# Patient Record
Sex: Male | Born: 1950 | Race: Black or African American | Hispanic: No | Marital: Married | Smoking: Never smoker
Health system: Southern US, Community
[De-identification: ages and names within clinical notes are randomized; demographics above are authoritative.]

## PROBLEM LIST (undated history)

## (undated) DIAGNOSIS — I639 Cerebral infarction, unspecified: Secondary | ICD-10-CM

## (undated) DIAGNOSIS — E785 Hyperlipidemia, unspecified: Secondary | ICD-10-CM

## (undated) DIAGNOSIS — I10 Essential (primary) hypertension: Secondary | ICD-10-CM

## (undated) HISTORY — PX: MOUTH SURGERY: SHX715

## (undated) HISTORY — PX: COLONOSCOPY: SHX174

---

## 2008-01-04 ENCOUNTER — Inpatient Hospital Stay: Payer: Self-pay | Admitting: Internal Medicine

## 2008-01-04 ENCOUNTER — Other Ambulatory Visit: Payer: Self-pay

## 2012-03-24 ENCOUNTER — Emergency Department: Payer: Self-pay | Admitting: *Deleted

## 2012-03-24 LAB — CBC
HGB: 13.5 g/dL (ref 13.0–18.0)
MCH: 29.6 pg (ref 26.0–34.0)
MCHC: 32.5 g/dL (ref 32.0–36.0)
MCV: 91 fL (ref 80–100)
Platelet: 279 10*3/uL (ref 150–440)
RDW: 13.2 % (ref 11.5–14.5)
WBC: 11.5 10*3/uL — ABNORMAL HIGH (ref 3.8–10.6)

## 2012-03-24 LAB — URINALYSIS, COMPLETE
Blood: NEGATIVE
Glucose,UR: NEGATIVE mg/dL (ref 0–75)
Leukocyte Esterase: NEGATIVE
Nitrite: NEGATIVE
Ph: 5 (ref 4.5–8.0)
Protein: 30
Specific Gravity: 1.029 (ref 1.003–1.030)
Squamous Epithelial: 1
WBC UR: 2 /HPF (ref 0–5)

## 2012-03-24 LAB — COMPREHENSIVE METABOLIC PANEL
Alkaline Phosphatase: 68 U/L (ref 50–136)
Anion Gap: 14 (ref 7–16)
Calcium, Total: 9.6 mg/dL (ref 8.5–10.1)
Chloride: 107 mmol/L (ref 98–107)
Creatinine: 1.28 mg/dL (ref 0.60–1.30)
EGFR (African American): >60
EGFR (Non-African Amer.): >60
Glucose: 110 mg/dL — ABNORMAL HIGH (ref 65–99)
Osmolality: 282 (ref 275–301)
Potassium: 4 mmol/L (ref 3.5–5.1)
SGPT (ALT): 42 U/L
Sodium: 139 mmol/L (ref 136–145)
Total Protein: 8.9 g/dL — ABNORMAL HIGH (ref 6.4–8.2)

## 2012-03-24 LAB — CK TOTAL AND CKMB (NOT AT ARMC)
CK, Total: 355 U/L — ABNORMAL HIGH (ref 35–232)
CK-MB: 2.6 ng/mL (ref 0.5–3.6)

## 2012-03-24 LAB — DRUG SCREEN, URINE
Amphetamines, Ur Screen: NEGATIVE (ref ?–1000)
Cannabinoid 50 Ng, Ur ~~LOC~~: NEGATIVE (ref ?–50)
Cocaine Metabolite,Ur ~~LOC~~: NEGATIVE (ref ?–300)
Opiate, Ur Screen: POSITIVE (ref ?–300)
Phencyclidine (PCP) Ur S: NEGATIVE (ref ?–25)
Tricyclic, Ur Screen: NEGATIVE (ref ?–1000)

## 2014-08-30 ENCOUNTER — Ambulatory Visit: Payer: Self-pay | Admitting: Gastroenterology

## 2014-09-03 LAB — PATHOLOGY REPORT

## 2016-02-27 ENCOUNTER — Ambulatory Visit: Payer: Self-pay | Admitting: Physician Assistant

## 2016-02-27 ENCOUNTER — Encounter: Payer: Self-pay | Admitting: Physician Assistant

## 2016-02-27 VITALS — BP 110/70 | HR 87 | Temp 98.5°F

## 2016-02-27 DIAGNOSIS — J069 Acute upper respiratory infection, unspecified: Secondary | ICD-10-CM

## 2016-02-27 MED ORDER — FLUTICASONE PROPIONATE 50 MCG/ACT NA SUSP: 2.0000 | Freq: Every day | NASAL | Status: DC

## 2016-02-27 MED ORDER — AMOXICILLIN 875 MG PO TABS
875.0000 mg | ORAL_TABLET | Freq: Two times a day (BID) | ORAL | Status: DC
Start: 2016-02-27 — End: 2016-06-04

## 2016-02-27 NOTE — Progress Notes (Signed)
S: C/o runny nose and congestion for 3 days, no fever, chills, cp/sob, v/d; some sore throat, had loose stools this am, no abd pain, only 1 episode of diarrhea, denies body aches, didn't get flu shot; Using otc meds: nyquil, halls  O: PE: vitals wnl, nad, perrl eomi, normocephalic, tms dull, nasal mucosa red and swollen, throat injected, neck supple no lymph, lungs c t a, cv rrr, abd soft nontender bs normal all 4 quads, neuro intact, cough is congested  A:  Acute  uri   P: amoxil, flonase, otc immodium if needed, if diarrhea worsens stop the antibiotic; drink fluids, continue regular meds , use otc meds of choice, return if not improving in 5 days, return earlier if worsening

## 2016-06-04 ENCOUNTER — Encounter: Payer: Self-pay | Admitting: Physician Assistant

## 2016-06-04 ENCOUNTER — Ambulatory Visit: Payer: Self-pay | Admitting: Physician Assistant

## 2016-06-04 VITALS — BP 140/80 | HR 71 | Temp 98.1°F

## 2016-06-04 DIAGNOSIS — K2100 Gastro-esophageal reflux disease with esophagitis, without bleeding: Secondary | ICD-10-CM

## 2016-06-04 MED ORDER — OMEPRAZOLE 40 MG PO CPDR: 40.0000 mg | DELAYED_RELEASE_CAPSULE | Freq: Every day | ORAL | Status: DC

## 2016-06-04 NOTE — Progress Notes (Signed)
   Subjective:Stomach pain    Patient ID: Darin Lang, Darin Lang    DOB: 1951-12-06, 65 y.o.   MRN: [id number]  HPI Patient states abdominal and epigastric pain for 6 days. Onset after heavy meal on Father's day. Describes pain as burning sensation. Denies back radiating to back. States BM movements are normal. Mild nausea. No vomiting. Food and coffee increased discomfort. States mild relief with anti-acid. Rates pain as 8/10.Hyper    Review of Systems    Hypertension and hyperlipidema. Objective:   Physical Exam HEENT unremarkable, Neck supple, Lungs CTA and Heart RRR. No acute distress VSS. Negative HSM, decreased BS, Soft, non-tender to palpation.       Assessment & Plan:GERD  Trial of Omeprazole. Avoid late night meals before sleeping. Follow up 3 days if no improvement. Advised to go to ER if condition worsen.

## 2016-07-06 ENCOUNTER — Other Ambulatory Visit: Payer: Self-pay | Admitting: Physician Assistant

## 2016-11-12 ENCOUNTER — Ambulatory Visit: Payer: Self-pay | Admitting: Physician Assistant

## 2016-11-12 DIAGNOSIS — Z299 Encounter for prophylactic measures, unspecified: Secondary | ICD-10-CM

## 2016-11-12 NOTE — Progress Notes (Signed)
Patient came in to have blood drawn for testing per Mardella LaymanLindsey Cornetto's orders.

## 2016-11-13 LAB — CMP12+LP+TP+TSH+6AC+PSA+CBC…
ALBUMIN: 4.3 g/dL (ref 3.6–4.8)
ALT: 29 IU/L (ref 0–44)
AST: 30 IU/L (ref 0–40)
Albumin/Globulin Ratio: 1.7 (ref 1.2–2.2)
Alkaline Phosphatase: 59 IU/L (ref 39–117)
BASOS: 0 %
BUN / CREAT RATIO: 13 (ref 10–24)
BUN: 13 mg/dL (ref 8–27)
Basophils Absolute: 0 10*3/uL (ref 0.0–0.2)
Bilirubin Total: 0.5 mg/dL (ref 0.0–1.2)
CALCIUM: 9.4 mg/dL (ref 8.6–10.2)
CHOL/HDL RATIO: 3.2 ratio (ref 0.0–5.0)
CHOLESTEROL TOTAL: 162 mg/dL (ref 100–199)
CREATININE: 1.03 mg/dL (ref 0.76–1.27)
Chloride: 104 mmol/L (ref 96–106)
EOS (ABSOLUTE): 0.3 10*3/uL (ref 0.0–0.4)
EOS: 4 %
Free Thyroxine Index: 1.6 (ref 1.2–4.9)
GFR calc Af Amer: 89 mL/min/{1.73_m2} (ref >59)
GFR, EST NON AFRICAN AMERICAN: 77 mL/min/{1.73_m2} (ref >59)
GGT: 27 IU/L (ref 0–65)
GLOBULIN, TOTAL: 2.5 g/dL (ref 1.5–4.5)
Glucose: 88 mg/dL (ref 65–99)
HDL: 50 mg/dL (ref >39)
HEMATOCRIT: 36 % — AB (ref 37.5–51.0)
Hemoglobin: 12.4 g/dL — ABNORMAL LOW (ref 12.6–17.7)
Immature Grans (Abs): 0 10*3/uL (ref 0.0–0.1)
Immature Granulocytes: 0 %
Iron: 45 ug/dL (ref 38–169)
LDH: 212 IU/L (ref 121–224)
LDL Calculated: 99 mg/dL (ref 0–99)
Lymphocytes Absolute: 2 10*3/uL (ref 0.7–3.1)
Lymphs: 30 %
MCH: 30.5 pg (ref 26.6–33.0)
MCHC: 34.4 g/dL (ref 31.5–35.7)
MCV: 89 fL (ref 79–97)
MONOS ABS: 0.6 10*3/uL (ref 0.1–0.9)
Monocytes: 9 %
NEUTROS ABS: 3.7 10*3/uL (ref 1.4–7.0)
NEUTROS PCT: 57 %
PHOSPHORUS: 3.5 mg/dL (ref 2.5–4.5)
POTASSIUM: 4.2 mmol/L (ref 3.5–5.2)
PROSTATE SPECIFIC AG, SERUM: 1.1 ng/mL (ref 0.0–4.0)
Platelets: 265 10*3/uL (ref 150–379)
RBC: 4.06 x10E6/uL — ABNORMAL LOW (ref 4.14–5.80)
RDW: 14.4 % (ref 12.3–15.4)
SODIUM: 144 mmol/L (ref 134–144)
T3 Uptake Ratio: 23 % — ABNORMAL LOW (ref 24–39)
T4 TOTAL: 7 ug/dL (ref 4.5–12.0)
TRIGLYCERIDES: 67 mg/dL (ref 0–149)
TSH: 4.64 u[IU]/mL — AB (ref 0.450–4.500)
Total Protein: 6.8 g/dL (ref 6.0–8.5)
Uric Acid: 4.4 mg/dL (ref 3.7–8.6)
VLDL Cholesterol Cal: 13 mg/dL (ref 5–40)
WBC: 6.6 10*3/uL (ref 3.4–10.8)

## 2016-11-13 LAB — URINALYSIS, COMPLETE
Bilirubin, UA: NEGATIVE
Glucose, UA: NEGATIVE
Ketones, UA: NEGATIVE
LEUKOCYTES UA: NEGATIVE
Nitrite, UA: NEGATIVE
PH UA: 5.5 (ref 5.0–7.5)
Protein, UA: NEGATIVE
RBC, UA: NEGATIVE
Specific Gravity, UA: 1.026 (ref 1.005–1.030)
Urobilinogen, Ur: 0.2 mg/dL (ref 0.2–1.0)

## 2016-11-13 LAB — MICROSCOPIC EXAMINATION
Casts: NONE SEEN /lpf
RBC MICROSCOPIC, UA: NONE SEEN /HPF (ref 0–?)

## 2016-11-13 LAB — TESTOSTERONE: Testosterone: 532 ng/dL (ref 264–916)

## 2018-01-18 ENCOUNTER — Ambulatory Visit: Payer: Self-pay

## 2018-01-19 ENCOUNTER — Ambulatory Visit: Payer: Self-pay | Admitting: Medical

## 2018-01-19 VITALS — BP 123/66 | HR 76 | Temp 98.3°F | Resp 16 | Ht 67.0 in | Wt 193.0 lb

## 2018-01-19 DIAGNOSIS — I10 Essential (primary) hypertension: Secondary | ICD-10-CM | POA: Insufficient documentation

## 2018-01-19 DIAGNOSIS — Z008 Encounter for other general examination: Secondary | ICD-10-CM

## 2018-01-19 DIAGNOSIS — Z0189 Encounter for other specified special examinations: Principal | ICD-10-CM

## 2018-01-19 DIAGNOSIS — E785 Hyperlipidemia, unspecified: Secondary | ICD-10-CM | POA: Insufficient documentation

## 2018-01-19 NOTE — Progress Notes (Signed)
   Subjective:    Patient ID: Darin Lang, male    DOB: 10/15/1951, 67 y.o.   MRN: [id number]  HPI Here for annual physical and Bometric physical works in the [employer].  Review of Systems  Constitutional: Negative for chills and fever.  HENT: Positive for congestion and rhinorrhea (clear). Negative for sore throat and tinnitus.   Respiratory: Negative for cough and shortness of breath.   Cardiovascular: Negative for chest pain.  Gastrointestinal: Negative for abdominal pain.  Genitourinary: Positive for urgency. Negative for dysuria.  Musculoskeletal: Negative for myalgias.  Skin: Negative for rash.  Neurological: Negative for dizziness, syncope and light-headedness.  Psychiatric/Behavioral: Negative for behavioral problems, self-injury and suicidal ideas. The patient is not nervous/anxious.        Objective:   Physical Exam  Constitutional: He is oriented to person, place, and time. He appears well-developed and well-nourished.  HENT:  Head: Normocephalic and atraumatic.  Right Ear: Hearing, external ear and ear canal normal. A middle ear effusion is present.  Left Ear: Hearing, tympanic membrane, external ear and ear canal normal.  Nose: Rhinorrhea present.  Mouth/Throat: Uvula is midline, oropharynx is clear and moist and mucous membranes are normal.  Eyes: Conjunctivae, EOM and lids are normal. Pupils are equal, round, and reactive to light.  Fundoscopic exam:      The right eye shows red reflex.       The left eye shows red reflex.  Neck: Normal range of motion. Neck supple. No thyromegaly present.  Cardiovascular: Normal rate, regular rhythm and normal heart sounds.  Pulmonary/Chest: Breath sounds normal.  Abdominal: Soft. Normal appearance and bowel sounds are normal.  Musculoskeletal: Normal range of motion.  Lymphadenopathy:    He has no cervical adenopathy.  Neurological: He is alert and oriented to person, place, and time. He has normal strength. He displays a negative  Romberg sign. GCS eye subscore is 4. GCS verbal subscore is 5. GCS motor subscore is 6.  Reflex Scores:      Patellar reflexes are 2+ on the right side and 2+ on the left side.      Achilles reflexes are 2+ on the right side and 2+ on the left side. Skin: Skin is warm, dry and intact.  Psychiatric: He has a normal mood and affect. His speech is normal and behavior is normal. Judgment and thought content normal. Cognition and memory are normal.  Nursing note and vitals reviewed.   Finger to nose and heel to toe wnl      Assessment & Plan:  Physical exam , Biometric screening Wondering about  Colonoscopy, he had one 6-8 yrs ago at the [facility]. He is to contact them and see what his recommendation is based on his results.  He can either have us refer him or his primary doctor or the [facility]. He verbalizes understanding and has no questions at discharge

## 2018-01-20 LAB — CMP12+LP+TP+TSH+6AC+PSA+CBC…
A/G RATIO: 1.8 (ref 1.2–2.2)
ALT: 16 IU/L (ref 0–44)
AST: 19 IU/L (ref 0–40)
Albumin: 4.5 g/dL (ref 3.6–4.8)
Alkaline Phosphatase: 54 IU/L (ref 39–117)
BUN/Creatinine Ratio: 15 (ref 10–24)
BUN: 15 mg/dL (ref 8–27)
Basophils Absolute: 0 10*3/uL (ref 0.0–0.2)
Basos: 0 %
Bilirubin Total: 0.5 mg/dL (ref 0.0–1.2)
CHLORIDE: 104 mmol/L (ref 96–106)
CREATININE: 0.99 mg/dL (ref 0.76–1.27)
Calcium: 9.4 mg/dL (ref 8.6–10.2)
Chol/HDL Ratio: 3.7 ratio (ref 0.0–5.0)
Cholesterol, Total: 187 mg/dL (ref 100–199)
EOS (ABSOLUTE): 0.3 10*3/uL (ref 0.0–0.4)
ESTIMATED CHD RISK: 0.6 times avg. (ref 0.0–1.0)
Eos: 5 %
Free Thyroxine Index: 1.5 (ref 1.2–4.9)
GFR, EST AFRICAN AMERICAN: 93 mL/min/{1.73_m2} (ref >59)
GFR, EST NON AFRICAN AMERICAN: 80 mL/min/{1.73_m2} (ref >59)
GGT: 27 IU/L (ref 0–65)
GLUCOSE: 87 mg/dL (ref 65–99)
Globulin, Total: 2.5 g/dL (ref 1.5–4.5)
HDL: 51 mg/dL (ref >39)
Hematocrit: 37.4 % — ABNORMAL LOW (ref 37.5–51.0)
Hemoglobin: 12.7 g/dL — ABNORMAL LOW (ref 13.0–17.7)
IRON: 72 ug/dL (ref 38–169)
Immature Grans (Abs): 0 10*3/uL (ref 0.0–0.1)
Immature Granulocytes: 0 %
LDH: 189 IU/L (ref 121–224)
LDL Calculated: 118 mg/dL — ABNORMAL HIGH (ref 0–99)
Lymphocytes Absolute: 1.8 10*3/uL (ref 0.7–3.1)
Lymphs: 34 %
MCH: 29.6 pg (ref 26.6–33.0)
MCHC: 34 g/dL (ref 31.5–35.7)
MCV: 87 fL (ref 79–97)
MONOCYTES: 8 %
MONOS ABS: 0.4 10*3/uL (ref 0.1–0.9)
NEUTROS ABS: 2.9 10*3/uL (ref 1.4–7.0)
Neutrophils: 53 %
PHOSPHORUS: 2.8 mg/dL (ref 2.5–4.5)
POTASSIUM: 4.9 mmol/L (ref 3.5–5.2)
Platelets: 255 10*3/uL (ref 150–379)
Prostate Specific Ag, Serum: 1.5 ng/mL (ref 0.0–4.0)
RBC: 4.29 x10E6/uL (ref 4.14–5.80)
RDW: 13.6 % (ref 12.3–15.4)
Sodium: 143 mmol/L (ref 134–144)
T3 UPTAKE RATIO: 23 % — AB (ref 24–39)
T4, Total: 6.7 ug/dL (ref 4.5–12.0)
TOTAL PROTEIN: 7 g/dL (ref 6.0–8.5)
TSH: 2.99 u[IU]/mL (ref 0.450–4.500)
Triglycerides: 88 mg/dL (ref 0–149)
URIC ACID: 4.4 mg/dL (ref 3.7–8.6)
VLDL Cholesterol Cal: 18 mg/dL (ref 5–40)
WBC: 5.4 10*3/uL (ref 3.4–10.8)

## 2018-01-20 LAB — HCV COMMENT:

## 2018-01-20 LAB — HEPATITIS C ANTIBODY (REFLEX): HCV Ab: <0.1 s/co ratio (ref 0.0–0.9)

## 2018-01-20 LAB — VITAMIN D 25 HYDROXY (VIT D DEFICIENCY, FRACTURES): Vit D, 25-Hydroxy: 27.2 ng/mL — ABNORMAL LOW (ref 30.0–100.0)

## 2018-10-16 ENCOUNTER — Other Ambulatory Visit: Payer: Self-pay | Admitting: Family Medicine

## 2018-10-16 DIAGNOSIS — R221 Localized swelling, mass and lump, neck: Secondary | ICD-10-CM

## 2018-10-20 ENCOUNTER — Ambulatory Visit
Admission: RE | Admit: 2018-10-20 | Discharge: 2018-10-20 | Disposition: A | Discharge status: Home / Self Care | Payer: Managed Care, Other (non HMO) | Source: Ambulatory Visit | Attending: Family Medicine | Admitting: Family Medicine

## 2018-10-20 DIAGNOSIS — R221 Localized swelling, mass and lump, neck: Secondary | ICD-10-CM | POA: Diagnosis present

## 2020-01-06 ENCOUNTER — Encounter: Payer: Self-pay | Admitting: Emergency Medicine

## 2020-01-06 ENCOUNTER — Emergency Department: Payer: Medicare HMO

## 2020-01-06 ENCOUNTER — Emergency Department
Admission: EM | Admit: 2020-01-06 | Discharge: 2020-01-06 | Disposition: A | Discharge status: Home / Self Care | Payer: Medicare HMO | Attending: Emergency Medicine | Admitting: Emergency Medicine

## 2020-01-06 ENCOUNTER — Other Ambulatory Visit: Payer: Self-pay

## 2020-01-06 DIAGNOSIS — Z7982 Long term (current) use of aspirin: Secondary | ICD-10-CM | POA: Insufficient documentation

## 2020-01-06 DIAGNOSIS — I10 Essential (primary) hypertension: Secondary | ICD-10-CM | POA: Insufficient documentation

## 2020-01-06 DIAGNOSIS — Z79899 Other long term (current) drug therapy: Secondary | ICD-10-CM | POA: Diagnosis not present

## 2020-01-06 DIAGNOSIS — Z20822 Contact with and (suspected) exposure to covid-19: Secondary | ICD-10-CM | POA: Diagnosis not present

## 2020-01-06 DIAGNOSIS — R509 Fever, unspecified: Secondary | ICD-10-CM | POA: Insufficient documentation

## 2020-01-06 DIAGNOSIS — K115 Sialolithiasis: Secondary | ICD-10-CM | POA: Insufficient documentation

## 2020-01-06 DIAGNOSIS — K113 Abscess of salivary gland: Secondary | ICD-10-CM

## 2020-01-06 DIAGNOSIS — R6884 Jaw pain: Secondary | ICD-10-CM | POA: Diagnosis present

## 2020-01-06 HISTORY — DX: Hyperlipidemia, unspecified: E78.5

## 2020-01-06 HISTORY — DX: Essential (primary) hypertension: I10

## 2020-01-06 LAB — COMPREHENSIVE METABOLIC PANEL
ALT: 15 U/L (ref 0–44)
AST: 21 U/L (ref 15–41)
Albumin: 4 g/dL (ref 3.5–5.0)
Alkaline Phosphatase: 62 U/L (ref 38–126)
Anion gap: 11 (ref 5–15)
BUN: 28 mg/dL — ABNORMAL HIGH (ref 8–23)
CO2: 26 mmol/L (ref 22–32)
Calcium: 9.1 mg/dL (ref 8.9–10.3)
Chloride: 102 mmol/L (ref 98–111)
Creatinine, Ser: 1.22 mg/dL (ref 0.61–1.24)
GFR calc Af Amer: >60 mL/min (ref >60)
GFR calc non Af Amer: >60 mL/min (ref >60)
Glucose, Bld: 88 mg/dL (ref 70–99)
Potassium: 4 mmol/L (ref 3.5–5.1)
Sodium: 139 mmol/L (ref 135–145)
Total Bilirubin: 1.7 mg/dL — ABNORMAL HIGH (ref 0.3–1.2)
Total Protein: 8.3 g/dL — ABNORMAL HIGH (ref 6.5–8.1)

## 2020-01-06 LAB — CBC WITH DIFFERENTIAL/PLATELET
Abs Immature Granulocytes: 0.04 10*3/uL (ref 0.00–0.07)
Basophils Absolute: 0 10*3/uL (ref 0.0–0.1)
Basophils Relative: 0 %
Eosinophils Absolute: 0.2 10*3/uL (ref 0.0–0.5)
Eosinophils Relative: 3 %
HCT: 39.3 % (ref 39.0–52.0)
Hemoglobin: 13 g/dL (ref 13.0–17.0)
Immature Granulocytes: 0 %
Lymphocytes Relative: 19 %
Lymphs Abs: 1.8 10*3/uL (ref 0.7–4.0)
MCH: 29.8 pg (ref 26.0–34.0)
MCHC: 33.1 g/dL (ref 30.0–36.0)
MCV: 90.1 fL (ref 80.0–100.0)
Monocytes Absolute: 1.1 10*3/uL — ABNORMAL HIGH (ref 0.1–1.0)
Monocytes Relative: 11 %
Neutro Abs: 6.2 10*3/uL (ref 1.7–7.7)
Neutrophils Relative %: 67 %
Platelets: 246 10*3/uL (ref 150–400)
RBC: 4.36 MIL/uL (ref 4.22–5.81)
RDW: 12.6 % (ref 11.5–15.5)
WBC: 9.4 10*3/uL (ref 4.0–10.5)
nRBC: 0 % (ref 0.0–0.2)

## 2020-01-06 LAB — RESPIRATORY PANEL BY RT PCR (FLU A&B, COVID)
Influenza A by PCR: NEGATIVE
Influenza B by PCR: NEGATIVE
SARS Coronavirus 2 by RT PCR: NEGATIVE

## 2020-01-06 MED ORDER — CLINDAMYCIN HCL 150 MG PO CAPS: 300.0000 mg | ORAL_CAPSULE | Freq: Three times a day (TID) | ORAL | 0 refills | Status: DC

## 2020-01-06 MED ORDER — IOHEXOL 300 MG/ML  SOLN
75.0000 mL | Freq: Once | INTRAMUSCULAR | Status: AC | PRN
  Administered 2020-01-06: 12:00:00 75 mL via INTRAVENOUS
  Filled 2020-01-06: qty 75

## 2020-01-06 MED ORDER — MORPHINE SULFATE (PF) 4 MG/ML IV SOLN
4.0000 mg | Freq: Once | INTRAVENOUS | Status: AC
  Administered 2020-01-06: 4 mg via INTRAVENOUS
  Filled 2020-01-06: qty 1

## 2020-01-06 MED ORDER — HYDROCODONE-ACETAMINOPHEN 5-325 MG PO TABS: 1.0000 | ORAL_TABLET | Freq: Four times a day (QID) | ORAL | 0 refills | Status: DC | PRN

## 2020-01-06 MED ORDER — SODIUM CHLORIDE 0.9 % IV SOLN
3.0000 g | Freq: Once | INTRAVENOUS | Status: AC
  Administered 2020-01-06: 3 g via INTRAVENOUS
  Filled 2020-01-06: qty 8

## 2020-01-06 MED ORDER — PREDNISONE 10 MG (21) PO TBPK: ORAL_TABLET | ORAL | 0 refills | Status: DC

## 2020-01-06 MED ORDER — ONDANSETRON HCL 4 MG/2ML IJ SOLN
4.0000 mg | Freq: Once | INTRAMUSCULAR | Status: AC
  Administered 2020-01-06: 4 mg via INTRAVENOUS
  Filled 2020-01-06: qty 2

## 2020-01-06 MED ORDER — DEXAMETHASONE SODIUM PHOSPHATE 10 MG/ML IJ SOLN
10.0000 mg | Freq: Once | INTRAMUSCULAR | Status: AC
  Administered 2020-01-06: 10 mg via INTRAVENOUS
  Filled 2020-01-06: qty 1

## 2020-01-06 NOTE — ED Notes (Signed)
See triage note   Presents with pain to right side of jaw for the past week    Was seen at [facility] and placed on Augmentin for poss infection to salivary gland   Feels like sx's are getting worse  having increased pain with swallowing and chewing  Low grade temp on arrival

## 2020-01-06 NOTE — Discharge Instructions (Addendum)
Follow-up with Dr. Andee Poles tomorrow at [phone].  Take medication as prescribed.  Return if worsening.

## 2020-01-06 NOTE — ED Notes (Signed)
Discussed patient with Dr. Fuller Plan, no additional orders at this time.

## 2020-01-06 NOTE — ED Triage Notes (Signed)
Pt arrived via POV with reports of right side salivary gland problem x 1 week. Pt was seen at [facility] on 1/21 and prescribed augmentin BID which he states he started that day, pt states sxs are not getting any better, but not worsening.  Pt c/o pain when swallowing and with chewing. Swelling noted to right side.

## 2020-01-06 NOTE — ED Notes (Signed)
Patient is resting comfortably. 

## 2020-01-06 NOTE — ED Provider Notes (Signed)
[facility] Emergency Department Provider Note  ____________________________________________   First MD Initiated Contact with Patient 01/06/20 1015     (approximate)  I have reviewed the triage vital signs and the nursing notes.   HISTORY  Chief Complaint Sore Throat    HPI Darin Lang is a 69 y.o. male presents emergency department complaining of right-sided jaw pain for the past week.  Was seen at [facility] and told he had infection in his salivary gland.  States he has taken Augmentin for 3 days and has not had any relief.  Feels like he is getting worse.  Increased pain with swallowing and chewing.  States he has had a little low-grade fever.  No vomiting or diarrhea.  No chest pain or shortness of breath.    Past Medical History:  Diagnosis Date  . Hyperlipidemia   . Hypertension     Patient Active Problem List   Diagnosis Date Noted  . Benign essential hypertension 01/19/2018  . Other and unspecified hyperlipidemia 01/19/2018    Past Surgical History:  Procedure Laterality Date  . MOUTH SURGERY     salivary gland    Prior to Admission medications   Medication Sig Start Date End Date Taking? Authorizing Provider  aspirin 81 MG chewable tablet Chew by mouth daily.    [provider]  atorvastatin (LIPITOR) 20 MG tablet TAKE 1 TABLET (20 MG TOTAL) BY MOUTH ONCE DAILY. 01/31/16   [provider]  clindamycin (CLEOCIN) 150 MG capsule Take 2 capsules (300 mg total) by mouth 3 (three) times daily. 01/06/20   [name], Linden Dolin, PA-C  ferrous sulfate 325 (65 FE) MG tablet Take 325 mg by mouth daily with breakfast.    [provider]  HYDROcodone-acetaminophen (NORCO/VICODIN) 5-325 MG tablet Take 1 tablet by mouth every 6 (six) hours as needed for moderate pain. 01/06/20   [name], Linden Dolin, PA-C  losartan (COZAAR) 50 MG tablet TAKE 1 TABLET (50 MG TOTAL) BY MOUTH ONCE DAILY. 01/31/16   [provider]    multivitamin-iron-minerals-folic acid (CENTRUM) chewable tablet Chew 1 tablet by mouth daily.    [provider]  predniSONE (STERAPRED UNI-PAK 21 TAB) 10 MG (21) TBPK tablet Take 6 pills on day one then decrease by 1 pill each day 01/06/20   [name], PA-C    Allergies Patient has no known allergies.  No family history on file.  Social History Social History   Tobacco Use  . Smoking status: Never Smoker  . Smokeless tobacco: Never Used  Substance Use Topics  . Alcohol use: Not on file  . Drug use: Not on file    Review of Systems  Constitutional: No fever/chills Eyes: No visual changes. ENT: No sore throat.  Positive swelling on the right side of the neck Respiratory: Denies cough Cardiovascular: Denies chest pain Gastrointestinal: Denies abdominal pain Genitourinary: Negative for dysuria. Musculoskeletal: Negative for back pain. Skin: Negative for rash. Psychiatric: no mood changes,     ____________________________________________   PHYSICAL EXAM:  VITAL SIGNS: ED Triage Vitals  Enc Vitals Group     BP 01/06/20 0942 132/71     Pulse Rate 01/06/20 0942 (!) 108     Resp 01/06/20 0942 16     Temp 01/06/20 0942 99 F (37.2 C)     Temp Source 01/06/20 0942 Oral     SpO2 01/06/20 0942 100 %     Weight 01/06/20 0942 180 lb (81.6 kg)     Height 01/06/20  9563 5' 7"  (1.702 m)     Head Circumference --      Peak Flow --      Pain Score 01/06/20 0951 7     Pain Loc --      Pain Edu? --      Excl. in [place]? --     Constitutional: Alert and oriented. Well appearing and in no acute distress. Eyes: Conjunctivae are normal.  Head: Atraumatic. Nose: No congestion/rhinnorhea. Mouth/Throat: Mucous membranes are moist.  No dental abscess noted Neck:  supple no lymphadenopathy noted, large ?salivary stone is noted under the mandible Cardiovascular: Normal rate, regular rhythm. Heart sounds are normal Respiratory: Normal respiratory effort.  No retractions,  lungs c t a  GU: deferred Musculoskeletal: FROM all extremities, warm and well perfused Neurologic:  Normal speech and language.  Skin:  Skin is warm, dry and intact. No rash noted. Psychiatric: Mood and affect are normal. Speech and behavior are normal.  ____________________________________________   LABS (all labs ordered are listed, but only abnormal results are displayed)  Labs Reviewed  COMPREHENSIVE METABOLIC PANEL - Abnormal; Notable for the following components:      Result Value   BUN 28 (*)    Total Protein 8.3 (*)    Total Bilirubin 1.7 (*)    All other components within normal limits  CBC WITH DIFFERENTIAL/PLATELET - Abnormal; Notable for the following components:   Monocytes Absolute 1.1 (*)    All other components within normal limits  RESPIRATORY PANEL BY RT PCR (FLU A&B, COVID)   ____________________________________________   ____________________________________________  RADIOLOGY  CT soft tissue neck with IV contrast shows a salivary stone and a abscess  ____________________________________________   PROCEDURES  Procedure(s) performed: Saline lock, Unasyn IV, Decadron IV, morphine 4 mg IV, Zofran 4 mg  Procedures    ____________________________________________   INITIAL IMPRESSION / ASSESSMENT AND PLAN / ED COURSE  Pertinent labs & imaging results that were available during my care of the patient were reviewed by me and considered in my medical decision making (see chart for details).   Patient 69 year old male presents emergency department complaining of a salivary gland infection.  Was seen at [facility] and given Augmentin.  Is taking medication for 3 days and feels that he is getting worse instead of better.  Physical exam shows patient to appear well.  Vitals are normal.  Swelling noted to the right side of the neck along with a hard mass noted under the mandible  CBC, metabolic panel, CT soft tissue of the neck with Saline lock, morphine 4 mg  IV, Zofran 4 mg IV  CBC met BMP basically normal with some elevation in the BUN.  CT soft tissue of the neck does show an abscess surrounding a 8 x 10 mm stone, stranding into the soft tissues adjacent to the pharynx  Paged Dr. Pryor Ochoa He states he is in the hospital here and will come down to see the patient.  At first Dr. Pryor Ochoa thought he would be admitting the patient, however he was able to expel a large amount of fluid from the abscess around the stone.  He states that if the patient feels better after the steroids and antibiotics through the IV that he may be discharged.  I waited 2 hours before rechecking on the patient.  He states he feels much better.  He will be discharged with prescription for clindamycin, Sterapred, and Vicodin.  He is to follow-up with Dr. Pryor Ochoa tomorrow at 1015 in his office.  He states he understands will comply.  He was given strict instructions to return to the emergency department if worsening.   Darin Lang was evaluated in Emergency Department on 01/06/2020 for the symptoms described in the history of present illness. He was evaluated in the context of the global COVID-19 pandemic, which necessitated consideration that the patient might be at risk for infection with the SARS-CoV-2 virus that causes COVID-19. Institutional protocols and algorithms that pertain to the evaluation of patients at risk for COVID-19 are in a state of rapid change based on information released by regulatory bodies including the CDC and federal and state organizations. These policies and algorithms were followed during the patient's care in the ED.   As part of my medical decision making, I reviewed the following data within the [place] notes reviewed and incorporated, Labs reviewed see above, Old chart reviewed, Radiograph reviewed see above, A consult was requested and obtained from this/these consultant(s) ENT, Notes from prior ED visits and [place] Controlled  Substance Database  ____________________________________________   FINAL CLINICAL IMPRESSION(S) / ED DIAGNOSES  Final diagnoses:  Salivary gland abscess  Salivary stone      NEW MEDICATIONS STARTED DURING THIS VISIT:  Discharge Medication List as of 01/06/2020  3:41 PM    START taking these medications   Details  clindamycin (CLEOCIN) 150 MG capsule Take 2 capsules (300 mg total) by mouth 3 (three) times daily., Starting Sun 01/06/2020, Normal    HYDROcodone-acetaminophen (NORCO/VICODIN) 5-325 MG tablet Take 1 tablet by mouth every 6 (six) hours as needed for moderate pain., Starting Sun 01/06/2020, Normal    predniSONE (STERAPRED UNI-PAK 21 TAB) 10 MG (21) TBPK tablet Take 6 pills on day one then decrease by 1 pill each day, Normal         Note:  This document was prepared using Dragon voice recognition software and may include unintentional dictation errors.    [name], PA-C 01/06/20 1601    Vanessa [place], MD 01/07/20 [phone]

## 2020-01-06 NOTE — Consult Note (Signed)
Darin Lang, [name] [id number] 03/11/1951 Concha Se, MD  Reason for Consult: Sialolithiasis/sialoadenitits  HPI: 69 y.o. male presents to ED with 9 day history of right sided neck pain and swelling.  Patient reports similar issue 9 years ago treated with drainage at [facility] hospital and told he had salivary stone.  Seen by PCP and given antibiotics.  Reports increase in swelling and pain with chewing.  CT scan done with concern for abscess and large sialolith.  Patient reports pain with chewing and right neck pain.  Reports discharge from under tongue.  No breathing issues.  Allergies: No Known Allergies  ROS: Review of systems normal other than 12 systems except per HPI.  PMH:  Past Medical History:  Diagnosis Date  . Hyperlipidemia   . Hypertension     FH: No family history on file.  SH:  Social History   Socioeconomic History  . Marital status: Married    Spouse name: Not on file  . Number of children: Not on file  . Years of education: Not on file  . Highest education level: Not on file  Occupational History  . Not on file  Tobacco Use  . Smoking status: Never Smoker  . Smokeless tobacco: Never Used  Substance and Sexual Activity  . Alcohol use: Not on file  . Drug use: Not on file  . Sexual activity: Not on file  Other Topics Concern  . Not on file  Social History Narrative  . Not on file   Social Determinants of Health   Financial Resource Strain:   . Difficulty of Paying Living Expenses: Not on file  Food Insecurity:   . Worried About Programme researcher, broadcasting/film/video in the Last Year: Not on file  . Ran Out of Food in the Last Year: Not on file  Transportation Needs:   . Lack of Transportation (Medical): Not on file  . Lack of Transportation (Non-Medical): Not on file  Physical Activity:   . Days of Exercise per Week: Not on file  . Minutes of Exercise per Session: Not on file  Stress:   . Feeling of Stress : Not on file  Social Connections:   . Frequency of Communication  with Friends and Family: Not on file  . Frequency of Social Gatherings with Friends and Family: Not on file  . Attends Religious Services: Not on file  . Active Member of Clubs or Organizations: Not on file  . Attends Banker Meetings: Not on file  . Marital Status: Not on file  Intimate Partner Violence:   . Fear of Current or Ex-Partner: Not on file  . Emotionally Abused: Not on file  . Physically Abused: Not on file  . Sexually Abused: Not on file    PSH:  Past Surgical History:  Procedure Laterality Date  . MOUTH SURGERY     salivary gland    Physical  Exam:  GEN-  CN 2-12 grossly intact and symmetric. EARS- external ears clear NOSE-  Clear anteriorly OC/OP-  Purulence from wharton's duct on right with firmness along floor of mouth overlying submandibular gland.  No diffuse edema or floor of mouth fullness elsewhere NECK-  Right submandibular fullness and firmness, right submandibular gland massage able to decompress fullness of right neck with approximately 65ml of purulence suctioned from wharton's duct with significant decrease in size of right submandibular gland after massage RESP- unlaboered CARD-  RRR  CT-  Large right obstructive sialolith with fluid behind it consistant with sialolithiasis  with possible evolving abscess  A/P: Right submandibular sialolithiasis with sialoadenitis  Plan:  Discussed findings with patient.  Very large stone and some fluid on CT but able to express and decompress majority of it quite easily.  Recommend treatment with IV Unasyn and IV Decadron X1.  Instructed patient on submandibular massage, as well as hydration, as well as sialogogues and warm compress.  If patient improves as expected following IV treatment, OK to discharge home with follow up in my office tomorrow morning at 10:15.  Would discharge home on Clindamycin 300mg  PO QID x 10 days and Sterapred DS 6 day taper.  Discussed with patient that this needs surgical  intervention in future, but we want to cool down infection prior to proceeding.   Laconya Clere 01/06/2020 1:20 PM

## 2020-01-22 ENCOUNTER — Encounter
Admission: RE | Admit: 2020-01-22 | Discharge: 2020-01-22 | Disposition: A | Discharge status: Home / Self Care | Payer: Medicare HMO | Source: Ambulatory Visit | Attending: Otolaryngology | Admitting: Otolaryngology

## 2020-01-22 ENCOUNTER — Other Ambulatory Visit: Payer: Self-pay

## 2020-01-22 DIAGNOSIS — R9431 Abnormal electrocardiogram [ECG] [EKG]: Secondary | ICD-10-CM | POA: Insufficient documentation

## 2020-01-22 DIAGNOSIS — I10 Essential (primary) hypertension: Secondary | ICD-10-CM | POA: Insufficient documentation

## 2020-01-22 DIAGNOSIS — Z01818 Encounter for other preprocedural examination: Secondary | ICD-10-CM | POA: Diagnosis present

## 2020-01-22 DIAGNOSIS — Z20822 Contact with and (suspected) exposure to covid-19: Secondary | ICD-10-CM | POA: Insufficient documentation

## 2020-01-22 NOTE — Patient Instructions (Signed)
Your procedure is scheduled on: Tues 2/16 Report to Day Surgery.[facility] To find out your arrival time please call [phone] between 1PM - 3PM on Mon 2/15.  Remember: Instructions that are not followed completely may result in serious medical risk,  up to and including death, or upon the discretion of your surgeon and anesthesiologist your  surgery may need to be rescheduled.     _X__ 1. Do not eat food after midnight the night before your procedure.                 No gum chewing or hard candies. You may drink clear liquids up to 2 hours                 before you are scheduled to arrive for your surgery- DO not drink clear                 liquids within 2 hours of the start of your surgery.                 Clear Liquids include:  water, apple juice without pulp, clear carbohydrate                 drink such as Clearfast of Gatorade, Black Coffee or Tea (Do not add                 anything to coffee or tea).  __X__2.  On the morning of surgery brush your teeth with toothpaste and water, you                may rinse your mouth with mouthwash if you wish.  Do not swallow any toothpaste of mouthwash.     ___ 3.  No Alcohol for 24 hours before or after surgery.   __ 4.  Do Not Smoke or use e-cigarettes For 24 Hours Prior to Your Surgery.                 Do not use any chewable tobacco products for at least 6 hours prior to                 surgery.  ____  5.  Bring all medications with you on the day of surgery if instructed.   _x___  6.  Notify your doctor if there is any change in your medical condition      (cold, fever, infections).     Do not wear jewelry, make-up, hairpins, clips or nail polish. Do not wear lotions, powders, or perfumes. You may wear deodorant. Do not shave 48 hours prior to surgery. Men may shave face and neck. Do not bring valuables to the hospital.    [facility] is not responsible for any belongings or valuables.  Contacts,  dentures or bridgework may not be worn into surgery. Leave your suitcase in the car. After surgery it may be brought to your room. For patients admitted to the hospital, discharge time is determined by your treatment team.   Patients discharged the day of surgery will not be allowed to drive home.   Please read over the following fact sheets that you were given:     __x__ Take these medicines the morning of surgery with A SIP OF WATER:    1. HYDROcodone-acetaminophen (NORCO/VICODIN) 5-325 MG tablet if needed  2.   3.   4.  5.  6.  ____ Fleet Enema (as directed)   _x___ Use CHG Soap as directed  ____ Use inhalers on the day of surgery  ____ Stop metformin 2 days prior to surgery    ____ Take 1/2 of usual insulin dose the night before surgery. No insulin the morning          of surgery.   _x___ Stopped aspirin already  _x___ Stop Anti-inflammatories no ibuprofen aleve or aspirin products   ____ Stop supplements until after surgery.    ____ Bring C-Pap to the hospital.

## 2020-01-25 ENCOUNTER — Other Ambulatory Visit: Payer: Medicare HMO

## 2020-01-25 ENCOUNTER — Other Ambulatory Visit: Payer: Self-pay

## 2020-01-25 ENCOUNTER — Encounter
Admission: RE | Admit: 2020-01-25 | Discharge: 2020-01-25 | Disposition: A | Discharge status: Home / Self Care | Payer: Medicare HMO | Source: Ambulatory Visit | Attending: Otolaryngology | Admitting: Otolaryngology

## 2020-01-25 DIAGNOSIS — Z01818 Encounter for other preprocedural examination: Secondary | ICD-10-CM | POA: Diagnosis not present

## 2020-01-25 LAB — SARS CORONAVIRUS 2 (TAT 6-24 HRS): SARS Coronavirus 2: NEGATIVE

## 2020-01-29 ENCOUNTER — Other Ambulatory Visit: Payer: Self-pay

## 2020-01-29 ENCOUNTER — Ambulatory Visit: Payer: Medicare HMO | Admitting: Anesthesiology

## 2020-01-29 ENCOUNTER — Encounter: Payer: Self-pay | Admitting: Otolaryngology

## 2020-01-29 ENCOUNTER — Observation Stay
Admission: AD | Admit: 2020-01-29 | Discharge: 2020-01-30 | Disposition: A | Discharge status: Home / Self Care | Payer: Medicare HMO | Attending: Otolaryngology | Admitting: Otolaryngology

## 2020-01-29 ENCOUNTER — Encounter
Admission: AD | Disposition: A | Discharge status: Home / Self Care | Payer: Self-pay | Source: Home / Self Care | Attending: Otolaryngology

## 2020-01-29 DIAGNOSIS — K1123 Chronic sialoadenitis: Secondary | ICD-10-CM | POA: Diagnosis not present

## 2020-01-29 DIAGNOSIS — Z79899 Other long term (current) drug therapy: Secondary | ICD-10-CM | POA: Insufficient documentation

## 2020-01-29 DIAGNOSIS — I10 Essential (primary) hypertension: Secondary | ICD-10-CM | POA: Diagnosis not present

## 2020-01-29 DIAGNOSIS — K115 Sialolithiasis: Secondary | ICD-10-CM | POA: Diagnosis present

## 2020-01-29 DIAGNOSIS — E785 Hyperlipidemia, unspecified: Secondary | ICD-10-CM | POA: Insufficient documentation

## 2020-01-29 DIAGNOSIS — Z7952 Long term (current) use of systemic steroids: Secondary | ICD-10-CM | POA: Insufficient documentation

## 2020-01-29 DIAGNOSIS — Z7982 Long term (current) use of aspirin: Secondary | ICD-10-CM | POA: Insufficient documentation

## 2020-01-29 DIAGNOSIS — Z9889 Other specified postprocedural states: Secondary | ICD-10-CM

## 2020-01-29 DIAGNOSIS — Z9089 Acquired absence of other organs: Secondary | ICD-10-CM

## 2020-01-29 HISTORY — PX: SUBMANDIBULAR GLAND EXCISION: SHX2456

## 2020-01-29 SURGERY — EXCISION, SUBMANDIBULAR GLAND
Anesthesia: General | Laterality: Right

## 2020-01-29 MED ORDER — ONDANSETRON HCL 4 MG/2ML IJ SOLN: 4.0000 mg | INTRAMUSCULAR | Status: DC | PRN

## 2020-01-29 MED ORDER — DEXAMETHASONE SODIUM PHOSPHATE 10 MG/ML IJ SOLN
INTRAMUSCULAR | Status: AC
  Filled 2020-01-29: qty 1

## 2020-01-29 MED ORDER — ATORVASTATIN CALCIUM 20 MG PO TABS
20.0000 mg | ORAL_TABLET | Freq: Every day | ORAL | Status: DC
  Administered 2020-01-29: 20 mg via ORAL
  Filled 2020-01-29: qty 1

## 2020-01-29 MED ORDER — LIDOCAINE HCL (PF) 2 % IJ SOLN
INTRAMUSCULAR | Status: AC
  Filled 2020-01-29: qty 10

## 2020-01-29 MED ORDER — MIDAZOLAM HCL 2 MG/2ML IJ SOLN
INTRAMUSCULAR | Status: DC | PRN
  Administered 2020-01-29: 2 mg via INTRAVENOUS

## 2020-01-29 MED ORDER — ADULT MULTIVITAMIN W/MINERALS CH
1.0000 | ORAL_TABLET | Freq: Every day | ORAL | Status: DC
  Administered 2020-01-30: 1 via ORAL
  Filled 2020-01-29: qty 1

## 2020-01-29 MED ORDER — ZOLPIDEM TARTRATE 5 MG PO TABS: 5.0000 mg | ORAL_TABLET | Freq: Every evening | ORAL | Status: DC | PRN

## 2020-01-29 MED ORDER — LIDOCAINE-EPINEPHRINE 1 %-1:100000 IJ SOLN
INTRAMUSCULAR | Status: AC
  Filled 2020-01-29: qty 1

## 2020-01-29 MED ORDER — MIDAZOLAM HCL 2 MG/2ML IJ SOLN
INTRAMUSCULAR | Status: AC
  Filled 2020-01-29: qty 2

## 2020-01-29 MED ORDER — SUCCINYLCHOLINE CHLORIDE 20 MG/ML IJ SOLN
INTRAMUSCULAR | Status: DC | PRN
  Administered 2020-01-29: 100 mg via INTRAVENOUS

## 2020-01-29 MED ORDER — FENTANYL CITRATE (PF) 100 MCG/2ML IJ SOLN
INTRAMUSCULAR | Status: AC
  Filled 2020-01-29: qty 2

## 2020-01-29 MED ORDER — FENTANYL CITRATE (PF) 100 MCG/2ML IJ SOLN
INTRAMUSCULAR | Status: AC
  Administered 2020-01-29: 25 ug via INTRAVENOUS
  Filled 2020-01-29: qty 2

## 2020-01-29 MED ORDER — ACETAMINOPHEN 650 MG RE SUPP: 650.0000 mg | RECTAL | Status: DC | PRN

## 2020-01-29 MED ORDER — LIDOCAINE-EPINEPHRINE 1 %-1:100000 IJ SOLN
INTRAMUSCULAR | Status: DC | PRN
  Administered 2020-01-29: 4 mL

## 2020-01-29 MED ORDER — ONDANSETRON HCL 4 MG PO TABS: 4.0000 mg | ORAL_TABLET | ORAL | Status: DC | PRN

## 2020-01-29 MED ORDER — EPHEDRINE SULFATE 50 MG/ML IJ SOLN
INTRAMUSCULAR | Status: DC | PRN
  Administered 2020-01-29: 15 mg via INTRAVENOUS
  Administered 2020-01-29: 5 mg via INTRAVENOUS
  Administered 2020-01-29 (×3): 10 mg via INTRAVENOUS

## 2020-01-29 MED ORDER — DEXAMETHASONE SODIUM PHOSPHATE 10 MG/ML IJ SOLN
INTRAMUSCULAR | Status: DC | PRN
  Administered 2020-01-29: 5 mg via INTRAVENOUS

## 2020-01-29 MED ORDER — LOSARTAN POTASSIUM 50 MG PO TABS
100.0000 mg | ORAL_TABLET | Freq: Every day | ORAL | Status: DC
  Administered 2020-01-29 – 2020-01-30 (×2): 100 mg via ORAL
  Filled 2020-01-29 (×2): qty 2

## 2020-01-29 MED ORDER — PHENYLEPHRINE HCL (PRESSORS) 10 MG/ML IV SOLN
INTRAVENOUS | Status: AC
  Filled 2020-01-29: qty 1

## 2020-01-29 MED ORDER — HYDROMORPHONE HCL 1 MG/ML IJ SOLN
INTRAMUSCULAR | Status: AC
  Filled 2020-01-29: qty 1

## 2020-01-29 MED ORDER — AMOXICILLIN-POT CLAVULANATE 875-125 MG PO TABS
1.0000 | ORAL_TABLET | Freq: Two times a day (BID) | ORAL | Status: DC
  Administered 2020-01-29 – 2020-01-30 (×3): 1 via ORAL
  Filled 2020-01-29 (×3): qty 1

## 2020-01-29 MED ORDER — ACETAMINOPHEN 160 MG/5ML PO SOLN
650.0000 mg | ORAL | Status: DC | PRN
  Filled 2020-01-29: qty 20.3

## 2020-01-29 MED ORDER — SENNOSIDES-DOCUSATE SODIUM 8.6-50 MG PO TABS: 1.0000 | ORAL_TABLET | Freq: Every evening | ORAL | Status: DC | PRN

## 2020-01-29 MED ORDER — LACTATED RINGERS IV SOLN: INTRAVENOUS | Status: DC

## 2020-01-29 MED ORDER — FAMOTIDINE 20 MG PO TABS: 20.0000 mg | ORAL_TABLET | Freq: Once | ORAL | Status: AC

## 2020-01-29 MED ORDER — ONDANSETRON HCL 4 MG/2ML IJ SOLN
INTRAMUSCULAR | Status: AC
  Filled 2020-01-29: qty 2

## 2020-01-29 MED ORDER — DEXMEDETOMIDINE HCL 200 MCG/2ML IV SOLN
INTRAVENOUS | Status: DC | PRN
  Administered 2020-01-29: 12 ug via INTRAVENOUS
  Administered 2020-01-29: 8 ug via INTRAVENOUS

## 2020-01-29 MED ORDER — SEVOFLURANE IN SOLN
RESPIRATORY_TRACT | Status: AC
  Filled 2020-01-29: qty 250

## 2020-01-29 MED ORDER — OXYCODONE HCL 5 MG PO TABS: 5.0000 mg | ORAL_TABLET | Freq: Once | ORAL | Status: DC | PRN

## 2020-01-29 MED ORDER — PROPOFOL 10 MG/ML IV BOLUS
INTRAVENOUS | Status: DC | PRN
  Administered 2020-01-29: 150 mg via INTRAVENOUS

## 2020-01-29 MED ORDER — FENTANYL CITRATE (PF) 100 MCG/2ML IJ SOLN
INTRAMUSCULAR | Status: DC | PRN
  Administered 2020-01-29 (×2): 25 ug via INTRAVENOUS
  Administered 2020-01-29: 50 ug via INTRAVENOUS
  Administered 2020-01-29: 100 ug via INTRAVENOUS

## 2020-01-29 MED ORDER — LIDOCAINE HCL (CARDIAC) PF 100 MG/5ML IV SOSY
PREFILLED_SYRINGE | INTRAVENOUS | Status: DC | PRN
  Administered 2020-01-29 (×2): 50 mg via INTRAVENOUS

## 2020-01-29 MED ORDER — PROPOFOL 10 MG/ML IV BOLUS
INTRAVENOUS | Status: AC
  Filled 2020-01-29: qty 20

## 2020-01-29 MED ORDER — ONDANSETRON HCL 4 MG/2ML IJ SOLN
INTRAMUSCULAR | Status: DC | PRN
  Administered 2020-01-29: 4 mg via INTRAVENOUS

## 2020-01-29 MED ORDER — MORPHINE SULFATE (PF) 2 MG/ML IV SOLN: 2.0000 mg | INTRAVENOUS | Status: DC | PRN

## 2020-01-29 MED ORDER — OXYCODONE HCL 5 MG/5ML PO SOLN: 5.0000 mg | ORAL | Status: DC | PRN

## 2020-01-29 MED ORDER — PHENYLEPHRINE HCL (PRESSORS) 10 MG/ML IV SOLN
INTRAVENOUS | Status: DC | PRN
  Administered 2020-01-29: 50 ug via INTRAVENOUS

## 2020-01-29 MED ORDER — DEXTROSE-NACL 5-0.45 % IV SOLN: INTRAVENOUS | Status: DC

## 2020-01-29 MED ORDER — EPHEDRINE SULFATE 50 MG/ML IJ SOLN
INTRAMUSCULAR | Status: AC
  Filled 2020-01-29: qty 1

## 2020-01-29 MED ORDER — FENTANYL CITRATE (PF) 100 MCG/2ML IJ SOLN
25.0000 ug | INTRAMUSCULAR | Status: DC | PRN
  Administered 2020-01-29: 25 ug via INTRAVENOUS

## 2020-01-29 MED ORDER — HYDROMORPHONE HCL 1 MG/ML IJ SOLN
INTRAMUSCULAR | Status: DC | PRN
  Administered 2020-01-29: .25 mg via INTRAVENOUS

## 2020-01-29 MED ORDER — OXYCODONE HCL 5 MG/5ML PO SOLN: 5.0000 mg | Freq: Once | ORAL | Status: DC | PRN

## 2020-01-29 MED ORDER — BACITRACIN ZINC 500 UNIT/GM EX OINT
TOPICAL_OINTMENT | CUTANEOUS | Status: AC
  Filled 2020-01-29: qty 28.35

## 2020-01-29 MED ORDER — FAMOTIDINE 20 MG PO TABS
ORAL_TABLET | ORAL | Status: AC
  Administered 2020-01-29: 20 mg via ORAL
  Filled 2020-01-29: qty 1

## 2020-01-29 SURGICAL SUPPLY — 43 items
BLADE SURG 15 STRL LF DISP TIS (BLADE) ×1 IMPLANT
BLADE SURG 15 STRL SS (BLADE) ×2
BULB RESERV EVAC DRAIN JP 100C (MISCELLANEOUS) ×3 IMPLANT
CANISTER SUCT 1200ML W/VALVE (MISCELLANEOUS) ×3 IMPLANT
CLOSURE WOUND 1/4X4 (GAUZE/BANDAGES/DRESSINGS)
CNTNR SPEC 2.5X3XGRAD LEK (MISCELLANEOUS)
CONT SPEC 4OZ STER OR WHT (MISCELLANEOUS)
CONTAINER SPEC 2.5X3XGRAD LEK (MISCELLANEOUS) IMPLANT
CORD BIP STRL DISP 12FT (MISCELLANEOUS) ×3 IMPLANT
COVER WAND RF STERILE (DRAPES) ×3 IMPLANT
DERMABOND ADVANCED (GAUZE/BANDAGES/DRESSINGS) ×2
DERMABOND ADVANCED .7 DNX12 (GAUZE/BANDAGES/DRESSINGS) ×1 IMPLANT
DRAIN TLS ROUND 10FR (DRAIN) IMPLANT
DRAPE MAG INST 16X20 L/F (DRAPES) ×3 IMPLANT
DRSG TEGADERM 2-3/8X2-3/4 SM (GAUZE/BANDAGES/DRESSINGS) ×3 IMPLANT
ELECT NEEDLE 20X.3 GREEN (MISCELLANEOUS)
ELECT REM PT RETURN 9FT ADLT (ELECTROSURGICAL) ×3
ELECTRODE NEEDLE 20X.3 GREEN (MISCELLANEOUS) IMPLANT
ELECTRODE REM PT RTRN 9FT ADLT (ELECTROSURGICAL) ×1 IMPLANT
FORCEPS JEWEL BIP 4-3/4 STR (INSTRUMENTS) ×3 IMPLANT
GAUZE 4X4 16PLY RFD (DISPOSABLE) IMPLANT
GLOVE BIO SURGEON STRL SZ7.5 (GLOVE) ×6 IMPLANT
GOWN STRL REUS W/ TWL LRG LVL3 (GOWN DISPOSABLE) ×3 IMPLANT
GOWN STRL REUS W/TWL LRG LVL3 (GOWN DISPOSABLE) ×6
HEMOSTAT SURGICEL 2X3 (HEMOSTASIS) ×3 IMPLANT
HOOK STAY BLUNT/RETRACTOR 5M (MISCELLANEOUS) IMPLANT
LABEL OR SOLS (LABEL) ×3 IMPLANT
NS IRRIG 500ML POUR BTL (IV SOLUTION) ×3 IMPLANT
PACK HEAD/NECK (MISCELLANEOUS) ×3 IMPLANT
PROBE MONO 100X0.75 ELECT 1.9M (MISCELLANEOUS) IMPLANT
SHEARS HARMONIC 9CM CVD (BLADE) ×3 IMPLANT
SPONGE KITTNER 5P (MISCELLANEOUS) ×9 IMPLANT
STAPLER SKIN PROX 35W (STAPLE) IMPLANT
STRIP CLOSURE SKIN 1/4X4 (GAUZE/BANDAGES/DRESSINGS) IMPLANT
SUT ETHILON 4-0 (SUTURE) ×2
SUT ETHILON 4-0 FS2 18XMFL BLK (SUTURE) ×1
SUT PROLENE 6 0 P 1 18 (SUTURE) ×3 IMPLANT
SUT SILK 2 0 (SUTURE) ×2
SUT SILK 2 0 SH (SUTURE) ×3 IMPLANT
SUT SILK 2-0 18XBRD TIE 12 (SUTURE) ×1 IMPLANT
SUT VIC AB 4-0 RB1 18 (SUTURE) ×3 IMPLANT
SUTURE ETHLN 4-0 FS2 18XMF BLK (SUTURE) ×1 IMPLANT
SYSTEM CHEST DRAIN TLS 7FR (DRAIN) IMPLANT

## 2020-01-29 NOTE — H&P (Signed)
..  History and Physical paper copy reviewed and updated date of procedure and will be scanned into system.  Patient seen and examined and marked.  

## 2020-01-29 NOTE — Anesthesia Preprocedure Evaluation (Signed)
Anesthesia Evaluation  Patient identified by MRN, date of birth, ID band Patient awake    Reviewed: Allergy & Precautions, H&P , NPO status , Patient's Chart, lab work & pertinent test results  Airway Mallampati: II  TM Distance: >3 FB Neck ROM: full    Dental  (+) Teeth Intact,    Pulmonary neg pulmonary ROS, neg shortness of breath, neg COPD, Not current smoker,           Cardiovascular hypertension, (-) angina(-) Past MI and (-) Cardiac Stents (-) dysrhythmias      Neuro/Psych negative neurological ROS  negative psych ROS   GI/Hepatic negative GI ROS, Neg liver ROS,   Endo/Other  negative endocrine ROS  Renal/GU      Musculoskeletal   Abdominal   Peds  Hematology negative hematology ROS (+)   Anesthesia Other Findings Past Medical History: No date: Hyperlipidemia No date: Hypertension  Past Surgical History: No date: MOUTH SURGERY     Comment:  salivary gland     Reproductive/Obstetrics negative OB ROS                             Anesthesia Physical Anesthesia Plan  ASA: II  Anesthesia Plan: General ETT   Post-op Pain Management:    Induction:   PONV Risk Score and Plan: Ondansetron, Dexamethasone, Midazolam and Treatment may vary due to age or medical condition  Airway Management Planned:   Additional Equipment:   Intra-op Plan:   Post-operative Plan:   Informed Consent: I have reviewed the patients History and Physical, chart, labs and discussed the procedure including the risks, benefits and alternatives for the proposed anesthesia with the patient or authorized representative who has indicated his/her understanding and acceptance.     Dental Advisory Given  Plan Discussed with: Anesthesiologist  Anesthesia Plan Comments:         Anesthesia Quick Evaluation

## 2020-01-29 NOTE — Transfer of Care (Signed)
Immediate Anesthesia Transfer of Care Note  Patient: Darin Lang  Procedure(s) Performed: EXCISION SUBMANDIBULAR GLAND (Right )  Patient Location: PACU  Anesthesia Type:General  Level of Consciousness: sedated and responds to stimulation  Airway & Oxygen Therapy: Patient Spontanous Breathing and Patient connected to face mask oxygen  Post-op Assessment: Report given to RN and Post -op Vital signs reviewed and stable  Post vital signs: Reviewed and stable  Last Vitals:  Vitals Value Taken Time  BP 114/65 01/29/20 1052  Temp    Pulse 84 01/29/20 1052  Resp 19 01/29/20 1052  SpO2 100 % 01/29/20 1052  Vitals shown include unvalidated device data.  Last Pain:  Vitals:   01/29/20 0719  TempSrc: Temporal  PainSc: 3          Complications: No apparent anesthesia complications

## 2020-01-29 NOTE — Anesthesia Procedure Notes (Signed)
Procedure Name: Intubation Date/Time: 01/29/2020 9:05 AM Performed by: Jonna Clark, CRNA Pre-anesthesia Checklist: Patient identified, Patient being monitored, Timeout performed, Emergency Drugs available and Suction available Patient Re-evaluated:Patient Re-evaluated prior to induction Oxygen Delivery Method: Circle system utilized Preoxygenation: Pre-oxygenation with 100% oxygen Induction Type: IV induction and Cricoid Pressure applied Ventilation: Mask ventilation without difficulty Laryngoscope Size: Mac and 4 Grade View: Grade I Tube type: Oral Tube size: 7.5 mm Number of attempts: 2 Placement Confirmation: ETT inserted through vocal cords under direct vision,  positive ETCO2 and breath sounds checked- equal and bilateral Secured at: 22 cm Tube secured with: Tape Dental Injury: Teeth and Oropharynx as per pre-operative assessment  Difficulty Due To: Difficult Airway- due to anterior larynx Future Recommendations: Recommend- induction with short-acting agent, and alternative techniques readily available

## 2020-01-29 NOTE — Progress Notes (Signed)
..   01/29/2020 4:32 PM  Buttrey, Delta [id number]  Post-Op Day 0    Temp:  [97 F (36.1 C)-98.2 F (36.8 C)] 98.2 F (36.8 C) (02/16 1311) Pulse Rate:  [83-93] 87 (02/16 1443) Resp:  [10-21] 10 (02/16 1311) BP: (110-130)/(52-75) 110/52 (02/16 1443) SpO2:  [79 %-100 %] 100 % (02/16 1311),     Intake/Output Summary (Last 24 hours) at 01/29/2020 1632 Last data filed at 01/29/2020 1300 Gross per 24 hour  Intake 1050 ml  Output -  Net 1050 ml    No results found for this or any previous visit (from the past 24 hour(s)).  SUBJECTIVE:  No acute events.  Tolerating PO.  Ambulating  OBJECTIVE:  NAD, NECK- supple and soft and appropriately tender.  Drain milked with serosanguinous drainage  IMPRESSION:  S/p right submandibular gland excision for sialolithiasis  PLAN:  Observe overnight with drain.  Will saline lock IV.  Gwendolyn Mclees 01/29/2020, 4:32 PM

## 2020-01-29 NOTE — Op Note (Signed)
..01/29/2020  10:33 AM    Erline Hau  [id number]   Pre-Op Dx: Right salivary gland stone, Right submandibular sialoadenitis  Post-op Dx: same  Proc: Right Submandibular Gland Excision  Surg: Roney Mans Alailah Safley  Asst:  McQueen  Anes:  GOT  EBL: <63ml  Comp:  none  Findings:  Large impacted duct stone, right lingual nerve splayed over top of dilated duct with impacted stone within it carefully dissected away from duct, right hypoglossal nerve identified and preserved.  Procedure:  After the patient was identified in hold and the history and physical and consent was reviewed and updated. The patient was marked in the normal fashion in an existing skin crease of his right neck just below the level of the submandibular gland approximately 2 finger lengths below the body of the mandible. The patient was next taken to the operating room and placed in a supine position. General endotracheal anesthesia was induced in the normal fashion. The patient's right neck was neck injected with 4cc's of 1% lidocaine with 1:100,000 Epinephrine. The patient was next prepped and draped in a sterile normal fashion.  At this time, a 15 blade scalpel was used to make a skin incision along the previously marked neck crease in the right neck after the proper time out was performed. Dissection was carefully performed through the subcutaneous tissues with combination of Bovie electrocautery and blunt dissection. The platysma muscle was divided with harmonic scalpel.  A nerve stimulator was used to ensure no injury to the marginal mandibular nerve was made. Blunt dissection inferiorly along the inferior edge of the submandibular gland was performed.  The fascia overlying the gland was divided and retracted superiorly with a Sin retractor.  Continued blunt and sharp dissection with Harmonic Scalpel was performed circumferentially around the gland.  The facial artery was noted posteriorly and was divided with  combination of Harmonic scalpel and 2.0 suture tie.  The hypoglossal nerve deep to the digastric triangle was identified and preserved.  The Lingual nerve along the superior aspect of the gland and duct was identified.  This was moderately fibrotically attached to a dilated Wharton's duct.  This was splayed over top of the dilated duct and this was meticulously dissected away from the duct.  The submandibular ganglion was divided with Harmonic Scalpel with care taken to avoid injury to the Lingual nerve.  Due to the size of the stone, the duct was incised vertically and the large 2cm stone removed.  This allowed easier dissection of the duct superiorly.  The remaining attachments of the gland deeply were next carefully transected with the Harmonic scalpel again with taking care to avoid injury to the hypoglossal and lingual nerves.  A separate branch of the facial artery was also divided deep and on the posterior aspect of the gland.  At this time, with the gland pedicled on a dilated Wharton's duct. The duct was crossed with a right angle clamp and then divided with Harmonic scalpel and tied with a 2.0 Silk suture.  Further manipulation of the wound failed to reveal any additional stones.  At this time, the wound was copiously irrigated with sterile saline. Meticulous hemostasis with bipolar was obtained. Surgicel was placed in the wound bed.  A size 10 TLS drain was placed into the wound. The wound was then closed in a multilayered fashion with vicryl for subcutaneous tissues and Bacitracin for cutaneous skin and topped with a steri-strip.  At this time the patient was extubated and taken to PACU in  good condition.   Dispo: PACU in good condition.  Plan:  Ice, elevation, analgesia.  Observe for drain output.  Carloyn Manner 01/29/2020 10:33 AM

## 2020-01-30 ENCOUNTER — Other Ambulatory Visit: Payer: Medicare HMO

## 2020-01-30 DIAGNOSIS — K115 Sialolithiasis: Secondary | ICD-10-CM | POA: Diagnosis not present

## 2020-01-30 MED ORDER — SENNOSIDES-DOCUSATE SODIUM 8.6-50 MG PO TABS: 1.0000 | ORAL_TABLET | Freq: Two times a day (BID) | ORAL | 0 refills | Status: DC

## 2020-01-30 MED ORDER — AMOXICILLIN-POT CLAVULANATE 875-125 MG PO TABS: 1.0000 | ORAL_TABLET | Freq: Two times a day (BID) | ORAL | 0 refills | Status: DC

## 2020-01-30 MED ORDER — ONDANSETRON HCL 4 MG PO TABS: 4.0000 mg | ORAL_TABLET | Freq: Three times a day (TID) | ORAL | 0 refills | Status: DC | PRN

## 2020-01-30 MED ORDER — OXYCODONE-ACETAMINOPHEN 5-325 MG PO TABS: 1.0000 | ORAL_TABLET | Freq: Four times a day (QID) | ORAL | 0 refills | Status: AC | PRN

## 2020-01-30 NOTE — Anesthesia Postprocedure Evaluation (Signed)
Anesthesia Post Note  Patient: Darin Lang  Procedure(s) Performed: EXCISION SUBMANDIBULAR GLAND (Right )  Patient location during evaluation: PACU Anesthesia Type: General Level of consciousness: awake and alert Pain management: pain level controlled Vital Signs Assessment: post-procedure vital signs reviewed and stable Respiratory status: spontaneous breathing, nonlabored ventilation and respiratory function stable Cardiovascular status: blood pressure returned to baseline and stable Postop Assessment: no apparent nausea or vomiting Anesthetic complications: no     Last Vitals:  Vitals:   01/29/20 2008 01/30/20 0332  BP: 125/60 (!) 97/53  Pulse: 99 81  Resp: 20 16  Temp: 36.7 C 36.7 C  SpO2: 100% 100%    Last Pain:  Vitals:   01/30/20 0332  TempSrc: Oral  PainSc:                  [name]

## 2020-01-30 NOTE — Progress Notes (Signed)
Pt discharged per MD order. IV removed. Discharge instructions reviewed with pt. Pt verbalized understanding. All questions answered to pt satisfaction. Pt declined wheelchair and walked to his car.

## 2020-01-30 NOTE — Progress Notes (Signed)
..   01/30/2020 7:49 AM  Hemberger, Jakell [id number]  Post-Op Day 1    Temp:  [97 F (36.1 C)-98.2 F (36.8 C)] 98.1 F (36.7 C) (02/17 0332) Pulse Rate:  [81-99] 81 (02/17 0332) Resp:  [10-21] 16 (02/17 0332) BP: (97-125)/(52-68) 97/53 (02/17 0332) SpO2:  [98 %-100 %] 100 % (02/17 0332) Weight:  [83.9 kg] 83.9 kg (02/16 1738),     Intake/Output Summary (Last 24 hours) at 01/30/2020 0749 Last data filed at 01/30/2020 0700 Gross per 24 hour  Intake 1050 ml  Output 1525 ml  Net -475 ml    No results found for this or any previous visit (from the past 24 hour(s)).  SUBJECTIVE:  No issues overnight.  Tolerating PO.  OBJECTIVE:  GEN- NAD NECK- incision c/d/i, drain removed  IMPRESSION:  S/p right submandibular gland removal  PLAN:  Discharge home  [facility] 01/30/2020, 7:49 AM

## 2020-01-31 LAB — SURGICAL PATHOLOGY

## 2020-08-19 ENCOUNTER — Encounter: Payer: Self-pay | Admitting: Medical Oncology

## 2020-08-19 ENCOUNTER — Observation Stay: Payer: Medicare HMO

## 2020-08-19 ENCOUNTER — Emergency Department: Payer: Medicare HMO

## 2020-08-19 ENCOUNTER — Other Ambulatory Visit: Payer: Self-pay

## 2020-08-19 ENCOUNTER — Observation Stay
Admit: 2020-08-19 | Discharge: 2020-08-19 | Disposition: A | Discharge status: Home / Self Care | Payer: Medicare HMO | Attending: Internal Medicine | Admitting: Internal Medicine

## 2020-08-19 ENCOUNTER — Observation Stay
Admission: EM | Admit: 2020-08-19 | Discharge: 2020-08-20 | Disposition: A | Discharge status: Home / Self Care | Payer: Medicare HMO | Attending: Internal Medicine | Admitting: Internal Medicine

## 2020-08-19 DIAGNOSIS — I10 Essential (primary) hypertension: Secondary | ICD-10-CM | POA: Diagnosis not present

## 2020-08-19 DIAGNOSIS — I6529 Occlusion and stenosis of unspecified carotid artery: Secondary | ICD-10-CM

## 2020-08-19 DIAGNOSIS — I639 Cerebral infarction, unspecified: Secondary | ICD-10-CM | POA: Diagnosis not present

## 2020-08-19 DIAGNOSIS — R2681 Unsteadiness on feet: Secondary | ICD-10-CM | POA: Diagnosis not present

## 2020-08-19 DIAGNOSIS — E785 Hyperlipidemia, unspecified: Secondary | ICD-10-CM | POA: Insufficient documentation

## 2020-08-19 DIAGNOSIS — Z7982 Long term (current) use of aspirin: Secondary | ICD-10-CM | POA: Diagnosis not present

## 2020-08-19 DIAGNOSIS — R479 Unspecified speech disturbances: Secondary | ICD-10-CM | POA: Diagnosis present

## 2020-08-19 DIAGNOSIS — Z79899 Other long term (current) drug therapy: Secondary | ICD-10-CM | POA: Diagnosis not present

## 2020-08-19 DIAGNOSIS — Z20822 Contact with and (suspected) exposure to covid-19: Secondary | ICD-10-CM | POA: Insufficient documentation

## 2020-08-19 DIAGNOSIS — R4701 Aphasia: Secondary | ICD-10-CM

## 2020-08-19 LAB — DIFFERENTIAL
Abs Immature Granulocytes: 0.01 10*3/uL (ref 0.00–0.07)
Basophils Absolute: 0 10*3/uL (ref 0.0–0.1)
Basophils Relative: 0 %
Eosinophils Absolute: 0.1 10*3/uL (ref 0.0–0.5)
Eosinophils Relative: 2 %
Immature Granulocytes: 0 %
Lymphocytes Relative: 34 %
Lymphs Abs: 1.6 10*3/uL (ref 0.7–4.0)
Monocytes Absolute: 0.4 10*3/uL (ref 0.1–1.0)
Monocytes Relative: 8 %
Neutro Abs: 2.6 10*3/uL (ref 1.7–7.7)
Neutrophils Relative %: 56 %

## 2020-08-19 LAB — COMPREHENSIVE METABOLIC PANEL
ALT: 16 U/L (ref 0–44)
AST: 28 U/L (ref 15–41)
Albumin: 4.3 g/dL (ref 3.5–5.0)
Alkaline Phosphatase: 54 U/L (ref 38–126)
Anion gap: 9 (ref 5–15)
BUN: 16 mg/dL (ref 8–23)
CO2: 27 mmol/L (ref 22–32)
Calcium: 9.2 mg/dL (ref 8.9–10.3)
Chloride: 101 mmol/L (ref 98–111)
Creatinine, Ser: 1.01 mg/dL (ref 0.61–1.24)
GFR calc Af Amer: >60 mL/min (ref >60)
GFR calc non Af Amer: >60 mL/min (ref >60)
Glucose, Bld: 92 mg/dL (ref 70–99)
Potassium: 3.6 mmol/L (ref 3.5–5.1)
Sodium: 137 mmol/L (ref 135–145)
Total Bilirubin: 1.2 mg/dL (ref 0.3–1.2)
Total Protein: 7.3 g/dL (ref 6.5–8.1)

## 2020-08-19 LAB — HIV ANTIBODY (ROUTINE TESTING W REFLEX): HIV Screen 4th Generation wRfx: NONREACTIVE

## 2020-08-19 LAB — CBC
HCT: 35.9 % — ABNORMAL LOW (ref 39.0–52.0)
Hemoglobin: 12.2 g/dL — ABNORMAL LOW (ref 13.0–17.0)
MCH: 30.4 pg (ref 26.0–34.0)
MCHC: 34 g/dL (ref 30.0–36.0)
MCV: 89.5 fL (ref 80.0–100.0)
Platelets: 232 10*3/uL (ref 150–400)
RBC: 4.01 MIL/uL — ABNORMAL LOW (ref 4.22–5.81)
RDW: 13.6 % (ref 11.5–15.5)
WBC: 4.7 10*3/uL (ref 4.0–10.5)
nRBC: 0 % (ref 0.0–0.2)

## 2020-08-19 LAB — SARS CORONAVIRUS 2 BY RT PCR (HOSPITAL ORDER, PERFORMED IN ~~LOC~~ HOSPITAL LAB): SARS Coronavirus 2: NEGATIVE

## 2020-08-19 LAB — APTT: aPTT: 32 seconds (ref 24–36)

## 2020-08-19 LAB — PROTIME-INR
INR: 1 (ref 0.8–1.2)
Prothrombin Time: 13.2 seconds (ref 11.4–15.2)

## 2020-08-19 LAB — ETHANOL: Alcohol, Ethyl (B): <10 mg/dL (ref <10)

## 2020-08-19 MED ORDER — ATORVASTATIN CALCIUM 20 MG PO TABS
80.0000 mg | ORAL_TABLET | Freq: Every day | ORAL | Status: DC
  Administered 2020-08-19 – 2020-08-20 (×2): 80 mg via ORAL
  Filled 2020-08-19 (×2): qty 4

## 2020-08-19 MED ORDER — ADULT MULTIVITAMIN W/MINERALS CH
1.0000 | ORAL_TABLET | Freq: Every day | ORAL | Status: DC
  Administered 2020-08-19 – 2020-08-20 (×2): 1 via ORAL
  Filled 2020-08-19 (×2): qty 1

## 2020-08-19 MED ORDER — ASPIRIN 325 MG PO TABS
325.0000 mg | ORAL_TABLET | Freq: Every day | ORAL | Status: DC
  Administered 2020-08-19 – 2020-08-20 (×2): 325 mg via ORAL
  Filled 2020-08-19 (×3): qty 1

## 2020-08-19 MED ORDER — SENNOSIDES-DOCUSATE SODIUM 8.6-50 MG PO TABS: 1.0000 | ORAL_TABLET | Freq: Every evening | ORAL | Status: DC | PRN

## 2020-08-19 MED ORDER — ACETAMINOPHEN 650 MG RE SUPP: 650.0000 mg | RECTAL | Status: DC | PRN

## 2020-08-19 MED ORDER — ACETAMINOPHEN 325 MG PO TABS: 650.0000 mg | ORAL_TABLET | ORAL | Status: DC | PRN

## 2020-08-19 MED ORDER — ACETAMINOPHEN 160 MG/5ML PO SOLN
650.0000 mg | ORAL | Status: DC | PRN
  Filled 2020-08-19: qty 20.3

## 2020-08-19 MED ORDER — ASPIRIN 300 MG RE SUPP
300.0000 mg | Freq: Every day | RECTAL | Status: DC
  Filled 2020-08-19: qty 1

## 2020-08-19 MED ORDER — STROKE: EARLY STAGES OF RECOVERY BOOK: Freq: Once | Status: DC

## 2020-08-19 NOTE — Consult Note (Addendum)
Referring Physician: Dr. Joylene Igo    Chief Complaint: Acute onset of expressive dysphasia  HPI: Darin Lang is an 69 y.o. right-handed male with a history of HTN and dyslipidemia, presenting with acute onset of expressive dysphasia, beginning at 47 PM on Monday. He noticed that he was having difficulty "getting the words out". His wife noticed that some of his sentences would come out garbled. He went to sleep and on awakening this AM his symptoms were still present, so he decided to come to the hospital for evaluation.   He denies any weakness, numbness, dysphagia, vision changes or dizziness. Also with no CP, SOB, nausea or vomiting.   Home medications include ASA and atorvastatin.   CT head: 1. Decreased attenuation in the anterior left thalamus raises concern for recent and potentially acute infarct in this area. No other findings suggesting potential acute infarct evident. 2. Extensive supratentorial small vessel disease which for the most part was present previously. Suspect small vessel disease throughout these areas. Note that areas of decreased attenuation in the superior right centrum semiovale are oriented perpendicular to the lateral ventricles, a finding that potentially may represent an underlying degree of demyelination. This appearance is stable compared to prior studies. 3. Slight arterial vascular calcification.  EKG: NSR  LSN: 9 PM on Monday tPA Given: No: Out of time window.   Past Medical History:  Diagnosis Date  . Hyperlipidemia   . Hypertension     Past Surgical History:  Procedure Laterality Date  . MOUTH SURGERY     salivary gland  . SUBMANDIBULAR GLAND EXCISION Right 01/29/2020   Procedure: EXCISION SUBMANDIBULAR GLAND;  Surgeon: Bud Face, MD;  Location: [facility] ORS;  Service: ENT;  Laterality: Right;    No family history on file. Social History:  reports that he has never smoked. He has never used smokeless tobacco. He reports previous alcohol  use. He reports that he does not use drugs.  Allergies: No Known Allergies  Home Medications: No current facility-administered medications on file prior to encounter.   Current Outpatient Medications on File Prior to Encounter  Medication Sig Dispense Refill  . aspirin 81 MG EC tablet Take 81 mg by mouth daily.     Marland Kitchen atorvastatin (LIPITOR) 20 MG tablet Take 20 mg by mouth daily.   11  . losartan (COZAAR) 100 MG tablet Take 100 mg by mouth daily.   11  . Multiple Vitamins-Minerals (CENTRUM) tablet Take 1 tablet by mouth daily.      Inpatient medications: .  stroke: mapping our early stages of recovery book   Does not apply Once  . aspirin  300 mg Rectal Daily   Or  . aspirin  325 mg Oral Daily  . atorvastatin  80 mg Oral Daily  . multivitamin with minerals  1 tablet Oral Daily     ROS: As per HPI. No other symptoms are endorsed by the patient.   Physical Examination: Blood pressure 125/77, pulse 65, resp. rate 18, height 5\' 7"  (1.702 m), weight 80.3 kg, SpO2 99 %.  HEENT: [place]/AT Lungs: Respirations unlabored Ext: No edema  Neurologic Examination: Mental Status: Alert, fully oriented, thought content appropriate.  Speech fluent with intact naming, repetition and comprehension. Wife and patient stated that speech was back to baseline. However, at end of exam the patient uttered a sentence that was dysarthric with a phonemic paraphasia, which the wife said was not normal for him and resembled his speech deficit from late yesterday.  Cranial Nerves: II:  Visual fields intact with no extinction to DSS. PERRL.  III,IV, VI: No ptosis. EOMI. No nystagmus.  V,VII: Smile and grimace are symmetric.  Facial temp sensation equal bilaterally VIII: Hearing intact to voice.  IX,X: Palate rises symmetrically XI: Symmetric shoulder shrug XII: midline tongue extension  Motor: Right : Upper extremity   5/5    Left:     Upper extremity   5/5  Lower extremity   5/5     Lower extremity   5/5 No  asymmetry noted on repeated strength testing of upper extremities.  No pronator drift.  No lag when initiating right sided movements.  Sensory: Temp and light touch sensation intact to BUE and BLE. No extinction to DSS.  Deep Tendon Reflexes:  2+ bilateral biceps, brachioradialis, patellae and achilles.  Toes downgoing bilaterally Cerebellar: No ataxia with FNF bilaterally.  Gait: Deferred  Results for orders placed or performed during the hospital encounter of 08/19/20 (from the past 48 hour(s))  SARS Coronavirus 2 by RT PCR (hospital order, performed in [facility] hospital lab) Nasopharyngeal Nasopharyngeal Swab     Status: None   Collection Time: 08/19/20 12:14 PM   Specimen: Nasopharyngeal Swab  Result Value Ref Range   SARS Coronavirus 2 NEGATIVE NEGATIVE    Comment: (NOTE) SARS-CoV-2 target nucleic acids are NOT DETECTED.  The SARS-CoV-2 RNA is generally detectable in upper and lower respiratory specimens during the acute phase of infection. The lowest concentration of SARS-CoV-2 viral copies this assay can detect is 250 copies / mL. A negative result does not preclude SARS-CoV-2 infection and should not be used as the sole basis for treatment or other patient management decisions.  A negative result may occur with improper specimen collection / handling, submission of specimen other than nasopharyngeal swab, presence of viral mutation(s) within the areas targeted by this assay, and inadequate number of viral copies (<250 copies / mL). A negative result must be combined with clinical observations, patient history, and epidemiological information.  Fact Sheet for Patients:   BoilerBrush.com.cy  Fact Sheet for Healthcare Providers: https://pope.com/  This test is not yet approved or  cleared by the [place] FDA and has been authorized for detection and/or diagnosis of SARS-CoV-2 by FDA under an Emergency Use Authorization  (EUA).  This EUA will remain in effect (meaning this test can be used) for the duration of the COVID-19 declaration under Section 564(b)(1) of the Act, 21 U.S.C. section 360bbb-3(b)(1), unless the authorization is terminated or revoked sooner.  Performed at [facility], [address] [place], [place] [address]   Ethanol     Status: None   Collection Time: 08/19/20 12:20 PM  Result Value Ref Range   Alcohol, Ethyl (B) <10 <10 mg/dL    Comment: (NOTE) Lowest detectable limit for serum alcohol is 10 mg/dL.  For medical purposes only. Performed at [facility], [address] [place], [place] [address]   Protime-INR     Status: None   Collection Time: 08/19/20 12:20 PM  Result Value Ref Range   Prothrombin Time 13.2 11.4 - 15.2 seconds   INR 1.0 0.8 - 1.2    Comment: (NOTE) INR goal varies based on device and disease states. Performed at [facility], [address] [place], [place] [address]   APTT     Status: None   Collection Time: 08/19/20 12:20 PM  Result Value Ref Range   aPTT 32 24 - 36 seconds    Comment: Performed at [facility], [address] [place], [place] [address]  CBC     Status: Abnormal   Collection Time: 08/19/20 12:20 PM  Result Value Ref Range   WBC 4.7 4.0 - 10.5 K/uL   RBC 4.01 (L) 4.22 - 5.81 MIL/uL   Hemoglobin 12.2 (L) 13.0 - 17.0 g/dL   HCT 40.935.9 (L) 39 - 52 %   MCV 89.5 80.0 - 100.0 fL   MCH 30.4 26.0 - 34.0 pg   MCHC 34.0 30.0 - 36.0 g/dL   RDW 81.113.6 91.411.5 - 78.215.5 %   Platelets 232 150 - 400 K/uL   nRBC 0.0 0.0 - 0.2 %    Comment: Performed at [facility], [address] [place], [place] [address]  Differential     Status: None   Collection Time: 08/19/20 12:20 PM  Result Value Ref Range   Neutrophils Relative % 56 %   Neutro Abs 2.6 1.7 - 7.7 K/uL   Lymphocytes Relative 34 %   Lymphs Abs 1.6 0.7 - 4.0 K/uL   Monocytes Relative 8 %   Monocytes Absolute 0.4 0 - 1 K/uL    Eosinophils Relative 2 %   Eosinophils Absolute 0.1 0 - 0 K/uL   Basophils Relative 0 %   Basophils Absolute 0.0 0 - 0 K/uL   Immature Granulocytes 0 %   Abs Immature Granulocytes 0.01 0.00 - 0.07 K/uL    Comment: Performed at [facility], [address] [place], [place] [address]  Comprehensive metabolic panel     Status: None   Collection Time: 08/19/20 12:20 PM  Result Value Ref Range   Sodium 137 135 - 145 mmol/L   Potassium 3.6 3.5 - 5.1 mmol/L   Chloride 101 98 - 111 mmol/L   CO2 27 22 - 32 mmol/L   Glucose, Bld 92 70 - 99 mg/dL    Comment: Glucose reference range applies only to samples taken after fasting for at least 8 hours.   BUN 16 8 - 23 mg/dL   Creatinine, Ser 7.841.01 0.61 - 1.24 mg/dL   Calcium 9.2 8.9 - 69.610.3 mg/dL   Total Protein 7.3 6.5 - 8.1 g/dL   Albumin 4.3 3.5 - 5.0 g/dL   AST 28 15 - 41 U/L   ALT 16 0 - 44 U/L   Alkaline Phosphatase 54 38 - 126 U/L   Total Bilirubin 1.2 0.3 - 1.2 mg/dL   GFR calc non Af Amer >60 >60 mL/min   GFR calc Af Amer >60 >60 mL/min   Anion gap 9 5 - 15    Comment: Performed at [facility], [address] [place], [place] [address]   CT HEAD WO CONTRAST  Result Date: 08/19/2020 CLINICAL DATA:  Dysarthria EXAM: CT HEAD WITHOUT CONTRAST TECHNIQUE: Contiguous axial images were obtained from the base of the skull through the vertex without intravenous contrast. COMPARISON:  July 24, 2012 head CT; brain MRI January 04, 2008 FINDINGS: Brain: There is age related volume loss. There is no demonstrable intracranial mass, hemorrhage, extra-axial fluid collection, or midline shift. There is extensive decreased attenuation throughout the centra semiovale bilaterally. Foci of decreased attenuation oriented parallel to the lateral ventricles in the right centrum semiovale remain stable. In comparison with the previous study, there is decreased attenuation in the anterior left thalamus which may represent a recent and potentially  acute infarct. No other findings suggesting potential recent/acute infarct are appreciable. 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 Small vessel disease is also noted in the region of the extreme capsule on the left, likely chronic. Vascular: No hyperdense vessel. Slight arterial vascular calcification noted in the centra semiovale bilaterally. Skull: The bony calvarium appears intact. Sinuses/Orbits: Visualized paranasal sinuses are clear. Orbits appear symmetric bilaterally. Other: Mastoid air cells clear. IMPRESSION: 1. Decreased attenuation in the anterior left thalamus raises concern for recent and potentially acute infarct in this area. No other findings suggesting potential acute infarct evident. 2. Extensive supratentorial small vessel disease which for the most part was present previously. Suspect small vessel disease throughout these areas. Note that areas of decreased attenuation in the superior right centrum semiovale are oriented perpendicular to the lateral ventricles, a finding that potentially may represent an underlying degree of demyelination. This appearance is stable compared to prior studies. 3.  Slight arterial vascular calcification. Electronically Signed   By: Bretta Bang III M.D.   On: 08/19/2020 11:46    Assessment: 69 y.o. male presenting with new onset of expressive dysphasia 1. Exam reveals no focal deficit except for transient mild dysphasia at one point during the examination.  2. CT head reveals a small focus of decreased attenuation in the left thalamus.  3. Carotid ultrasound: Color duplex indicates minimal homogeneous plaque, with no hemodynamically significant stenosis by duplex criteria in the extracranial cerebrovascular circulation 4. Stroke Risk Factors - HLD and HTN  Recommendations: 1. HgbA1c, fasting lipid panel 2. MRI of the brain without contrast. Include MRA of the  brain 3. PT consult, OT consult, Speech consult 4. TTE 5. Cardiac telemetry 6. Prophylactic therapy- Agree with increasing ASA dose to 325 mg daily and atorvastatin to 80 mg daily 7. Risk factor modification 8. Frequent neuro checks 9. 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 Also noted is underlying age-related cerebral atrophy with advanced cerebral white matter disease, nonspecific, but most commonly related to chronic microvascular ischemic disease. -ECG monitoring reveals sinus bradycardia (59) -Echocardiogram has been completed, with report pending.  -MRA brain has been ordered to complete stroke work up.   @Electronically  signed: Dr.  08/19/2020, 2:56 PM

## 2020-08-19 NOTE — ED Triage Notes (Signed)
Pt here via ems from home- pt reports last night around 9pm he noticed that he was having difficulty speaking. Pt knows what he wants to say but has a hard time getting the words out. Pt denies any other sx's. Denies pain. Pt A/O x 4.

## 2020-08-19 NOTE — Evaluation (Signed)
Physical Therapy Evaluation Patient Details Name: Darin Lang MRN: [id number] DOB: 1951/02/09 Today's Date: 08/19/2020   History of Present Illness  Darin Lang is a 69 y/o male who came to the ED with complaints of word finding difficulties/expressive aphasia. PMH includes CVA, HTN, and dyslipidemia.  Clinical Impression  Pt lying on stretcher bed upon arrival to room, with wife at bedside, and agreeable to participate in PT evaluation this afternoon. Pt with good/equal strength (at least 4/5) in BUE/BLE. Pt noted to still have difficulty with word finding and took several attempts to give DOB. Pt may have some receptive difficulties as pt with occasional difficulty following multi-step commands. Pt performed bed mobility with mod I and sit <> stand transfers with supervision for safety, however no physical assistance was required. Pt ambulated 300 feet and performed dynamic activities including head turns/nods, speed changes, and 90 & 180 degree turns. Pt initially required CGA for steadying with ambulation as pt staggering and losing balance with turning which progressed to SBA/supervision for safety. Recommend outpatient PT at discharge from hospital to address minor gait/balance deficits. Will discharge from PT caseload at this time. If pt's status declines, please re-consult [place].    This entire session was guided, instructed, and directly supervised by Elizabeth Palau, DPT.   Follow Up Recommendations Outpatient PT    Equipment Recommendations  None recommended by PT    Recommendations for Other Services       Precautions / Restrictions Precautions Precautions: Fall Restrictions Weight Bearing Restrictions: No      Mobility  Bed Mobility Overal bed mobility: Modified Independent             General bed mobility comments: Pt performed bed mobility with mod I for slightly increased time to perform supine <> sit and required increased verbal cues to come into sitting edge of stretcher  bed. Pt denied dizziness with change in position.  Transfers Overall transfer level: Needs assistance Equipment used: None Transfers: Sit to/from Stand Sit to Stand: Supervision         General transfer comment: Np physical assistance required to perform sit <> stand transfer; supervision for safety. Pt denied dizziness with positional change.  Ambulation/Gait Ambulation/Gait assistance: Min guard Gait Distance (Feet): 300 Feet Assistive device: None Gait Pattern/deviations: Step-through pattern;Staggering left;Staggering right;Narrow base of support Gait velocity: average   General Gait Details: Pt ambulated initially with decreased BOS and noted to hit R foot on L foot when RLE in swing phase. PT noted to stagger to R and L within first 75 feet of ambulation requiring CGA for steadying and safety. Pt then progressed to SBA/supervision.  Stairs            Wheelchair Mobility    Modified Rankin (Stroke Patients Only)       Balance Overall balance assessment: Needs assistance Sitting-balance support: Feet supported Sitting balance-Leahy Scale: Good Sitting balance - Comments: no overt LOB with dynamic activity sitting edge of bed   Standing balance support: No upper extremity supported Standing balance-Leahy Scale: Good Standing balance comment: supervision-CGA for safety with standing activities                             Pertinent Vitals/Pain Pain Assessment: Faces Faces Pain Scale: Hurts a little bit Pain Location: L shoulder with movement Pain Descriptors / Indicators: Discomfort Pain Intervention(s): Monitored during session    Home Living Family/patient expects to be discharged to:: Private residence Living Arrangements:  Spouse/significant other Available Help at Discharge: Family;Available 24 hours/day Type of Home: House Home Access: Stairs to enter Entrance Stairs-Rails: None Entrance Stairs-Number of Steps: 2 Home Layout: Two  level;Other (Comment) (has basement) Home Equipment: Grab bars - tub/shower      Prior Function Level of Independence: Independent         Comments: Pt and wife report independence with ambulation, transfers, and ADLs. Pt denies fall history in last 6 months and endorses (+) driving.     Hand Dominance   Dominant Hand: Right    Extremity/Trunk Assessment   Upper Extremity Assessment Upper Extremity Assessment: Overall WFL for tasks assessed (grossly 4+/5 bilaterally)    Lower Extremity Assessment Lower Extremity Assessment: Overall WFL for tasks assessed    Cervical / Trunk Assessment Cervical / Trunk Assessment: Normal  Communication   Communication: Expressive difficulties  Cognition Arousal/Alertness: Awake/alert Behavior During Therapy: WFL for tasks assessed/performed Overall Cognitive Status: Within Functional Limits for tasks assessed                                        General Comments      Exercises Other Exercises Other Exercises: pt performed dynamic activities while ambulating including head turns, nods, 90 degree turns to L and R, 180 degree turns in both directions, and varying gait speeds; CGA to SBA provided for safety and steadying Other Exercises: pt performed object/color finding in hallway and visual acuity activities with good accuracy   Assessment/Plan    PT Assessment Patent does not need any further PT services  PT Problem List         PT Treatment Interventions      PT Goals (Current goals can be found in the Care Plan section)  Acute Rehab PT Goals Patient Stated Goal: none stated PT Goal Formulation: With patient Time For Goal Achievement: 08/19/20 Potential to Achieve Goals: Good    Frequency     Barriers to discharge        Co-evaluation               AM-PAC PT "6 Clicks" Mobility  Outcome Measure Help needed turning from your back to your side while in a flat bed without using bedrails?:  None Help needed moving from lying on your back to sitting on the side of a flat bed without using bedrails?: None Help needed moving to and from a bed to a chair (including a wheelchair)?: None Help needed standing up from a chair using your arms (e.g., wheelchair or bedside chair)?: None Help needed to walk in hospital room?: A Little Help needed climbing 3-5 steps with a railing? : A Little 6 Click Score: 22    End of Session Equipment Utilized During Treatment: Gait belt Activity Tolerance: Patient tolerated treatment well Patient left: in bed;with call bell/phone within reach;with family/visitor present Nurse Communication: Mobility status PT Visit Diagnosis: Unsteadiness on feet (R26.81);Muscle weakness (generalized) (M62.81);Other symptoms and signs involving the nervous system (R29.898)    Time: 1540-1601 PT Time Calculation (min) (ACUTE ONLY): 21 min   Charges:              Frederich Chick, SPT  Frederich Chick 08/19/2020, 4:36 PM

## 2020-08-19 NOTE — Progress Notes (Signed)
OT Cancellation Note  Patient Details Name: Darin Lang MRN: [id number] DOB: 03/23/1951   Cancelled Treatment:    Reason Eval/Treat Not Completed: Patient at procedure or test/ unavailable. Consult received, chart reviewed. Pt out of room for testing. Will re-attempt at later date/time as pt is available and medically appropriate.   Richrd Prime, MPH, MS, OTR/L ascom [phone] 08/19/20, 3:25 PM

## 2020-08-19 NOTE — Progress Notes (Addendum)
OT Screen Note  Patient Details Name: Darin Lang MRN: [id number] DOB: June 13, 1951   Cancelled Treatment:    Reason Eval/Treat Not Completed: OT screened, no needs identified, will sign off. Order received, chart reviewed. Pt back to baseline functional independence. No skilled OT needs identified. Will sign off. Please re-consult if additional needs arise.   Richrd Prime, MPH, MS, OTR/L ascom [phone] 08/19/20, 4:34 PM

## 2020-08-19 NOTE — ED Notes (Signed)
Pt in bed. Pt denies pain. ECHO at bedside. Wife at bedside

## 2020-08-19 NOTE — ED Provider Notes (Signed)
[facility] Emergency Department Provider Note   ____________________________________________   First MD Initiated Contact with Patient 08/19/20 1023     (approximate)  I have reviewed the triage vital signs and the nursing notes.   HISTORY  Chief Complaint difficulty speaking    HPI Darin Lang is a 69 y.o. male who presents via EMS from home after experiencing difficulty speaking that began around 2100 last night.  Patient states that he knows what he wants to say but has a hard time getting words out.  Patient denies any other symptoms and denies any exacerbating or relieving factors for the symptoms.  Patient denies any pain at this time.  Patient denies any medication changes or nonadherence.  Patient states that he had a TIA 1 year ago but cannot tell me what his symptoms were.         Past Medical History:  Diagnosis Date  . Hyperlipidemia   . Hypertension     Patient Active Problem List   Diagnosis Date Noted  . Acute CVA (cerebrovascular accident) (HCC) 08/19/2020  . H/O submandibular gland removal 01/29/2020  . Benign essential hypertension 01/19/2018  . Hyperlipidemia 01/19/2018    Past Surgical History:  Procedure Laterality Date  . MOUTH SURGERY     salivary gland  . SUBMANDIBULAR GLAND EXCISION Right 01/29/2020   Procedure: EXCISION SUBMANDIBULAR GLAND;  Surgeon: Bud Face, MD;  Location: [facility] ORS;  Service: ENT;  Laterality: Right;    Prior to Admission medications   Medication Sig Start Date End Date Taking? Authorizing Provider  aspirin 81 MG EC tablet Take 81 mg by mouth daily.     [provider]  atorvastatin (LIPITOR) 20 MG tablet Take 20 mg by mouth daily.  01/31/16   [provider]  losartan (COZAAR) 100 MG tablet Take 100 mg by mouth daily.  01/31/16   [provider]  Multiple Vitamins-Minerals (CENTRUM) tablet Take 1 tablet by mouth daily.     [provider]     Allergies Patient has no known allergies.  No family history on file.  Social History Social History   Tobacco Use  . Smoking status: Never Smoker  . Smokeless tobacco: Never Used  Vaping Use  . Vaping Use: Never used  Substance Use Topics  . Alcohol use: Not Currently    Alcohol/week: 0.0 standard drinks  . Drug use: Never    Review of Systems Constitutional: No fever/chills Eyes: No visual changes. ENT: No sore throat. Cardiovascular: Denies chest pain. Respiratory: Denies shortness of breath. Gastrointestinal: No abdominal pain.  No nausea, no vomiting.  No diarrhea. Genitourinary: Negative for dysuria. Musculoskeletal: Negative for acute arthralgias Skin: Negative for rash. Neurological: Negative for headaches, weakness/numbness/paresthesias in any extremity Psychiatric: Negative for suicidal ideation/homicidal ideation   ____________________________________________   PHYSICAL EXAM:  VITAL SIGNS: ED Triage Vitals  Enc Vitals Group     BP 08/19/20 1013 125/77     Pulse Rate 08/19/20 1010 65     Resp 08/19/20 1010 18     Temp --      Temp Source 08/19/20 1010 Oral     SpO2 08/19/20 1010 99 %     Weight 08/19/20 1012 177 lb (80.3 kg)     Height 08/19/20 1012 5\' 7"  (1.702 m)     Head Circumference --      Peak Flow --      Pain Score 08/19/20 1012 0     Pain Loc --  Pain Edu? --      Excl. in GC? --    Constitutional: Alert and oriented. Well appearing and in no acute distress. Eyes: Conjunctivae are normal. PERRL. EOMI. Head: Atraumatic. Nose: No congestion/rhinnorhea. Mouth/Throat: Mucous membranes are moist.  Oropharynx non-erythematous. Neck: No stridor.   Cardiovascular: Normal rate, regular rhythm. Grossly normal heart sounds.  Good peripheral circulation. Respiratory: Normal respiratory effort.  No retractions. Lungs CTAB. Gastrointestinal: Soft and nontender. No distention. No abdominal bruits. No CVA tenderness. Musculoskeletal: No  lower extremity tenderness nor edema.  No joint effusions. Neurologic: Mildly aphasic. No gross focal neurologic deficits are appreciated. No gait instability. Skin:  Skin is warm, dry and intact. No rash noted. Psychiatric: Mood and affect are normal. Speech and behavior are normal.  ____________________________________________   LABS (all labs ordered are listed, but only abnormal results are displayed)  Labs Reviewed  CBC - Abnormal; Notable for the following components:      Result Value   RBC 4.01 (*)    Hemoglobin 12.2 (*)    HCT 35.9 (*)    All other components within normal limits  SARS CORONAVIRUS 2 BY RT PCR (HOSPITAL ORDER, PERFORMED IN [facility])  ETHANOL  PROTIME-INR  APTT  DIFFERENTIAL  COMPREHENSIVE METABOLIC PANEL  URINE DRUG SCREEN, QUALITATIVE ([facility] ONLY)  URINALYSIS, ROUTINE W REFLEX MICROSCOPIC  HIV ANTIBODY (ROUTINE TESTING W REFLEX)  HEMOGLOBIN A1C  LIPID PANEL   ____________________________________________  EKG ED ECG REPORT I, Merwyn Katos, the attending physician, personally viewed and interpreted this ECG.  Date: 08/19/2020 EKG Time: 1012 Rate: 59 Rhythm: normal sinus rhythm QRS Axis: normal Intervals: normal ST/T Wave abnormalities: normal Narrative Interpretation: no evidence of acute ischemia  ____________________________________________  RADIOLOGY  ED MD interpretation: CT noncontrast of the brain shows decreased attenuation in the anterior left thalamus concerning for possible recent or acute infarct.  Patient also has extensive supratentorial small vessel disease and slight arterial vascular calcification.  Official radiology report(s): CT HEAD WO CONTRAST  Result Date: 08/19/2020 CLINICAL DATA:  Dysarthria EXAM: CT HEAD WITHOUT CONTRAST TECHNIQUE: Contiguous axial images were obtained from the base of the skull through the vertex without intravenous contrast. COMPARISON:  July 24, 2012 head CT; brain MRI January 04, 2008 FINDINGS: Brain: There is age related volume loss. There is no demonstrable intracranial mass, hemorrhage, extra-axial fluid collection, or midline shift. There is extensive decreased attenuation throughout the centra semiovale bilaterally. Foci of decreased attenuation oriented parallel to the lateral ventricles in the right centrum semiovale remain stable. In comparison with the previous study, there is decreased attenuation in the anterior left thalamus which may represent a recent and potentially acute infarct. No other findings suggesting potential recent/acute infarct are appreciable. There is evidence what appears to be a chronic infarct in the genu of the left internal capsule, compared to previous studies. Small vessel disease is also noted in the region of the extreme capsule on the left, likely chronic. Vascular: No hyperdense vessel. Slight arterial vascular calcification noted in the centra semiovale bilaterally. Skull: The bony calvarium appears intact. Sinuses/Orbits: Visualized paranasal sinuses are clear. Orbits appear symmetric bilaterally. Other: Mastoid air cells clear. IMPRESSION: 1. Decreased attenuation in the anterior left thalamus raises concern for recent and potentially acute infarct in this area. No other findings suggesting potential acute infarct evident. 2. Extensive supratentorial small vessel disease which for the most part was present previously. Suspect small vessel disease throughout these areas. Note that areas of decreased attenuation in  the superior right centrum semiovale are oriented perpendicular to the lateral ventricles, a finding that potentially may represent an underlying degree of demyelination. This appearance is stable compared to prior studies. 3.  Slight arterial vascular calcification. Electronically Signed   By: Bretta Bang III M.D.   On: 08/19/2020 11:46    ____________________________________________   PROCEDURES  Procedure(s) performed  (including Critical Care):  .Critical Care Performed by: Merwyn Katos, MD Authorized by: Merwyn Katos, MD   Critical care provider statement:    Critical care time (minutes):  47   Critical care was time spent personally by me on the following activities:  Discussions with consultants, evaluation of patient's response to treatment, examination of patient, ordering and performing treatments and interventions, ordering and review of laboratory studies, ordering and review of radiographic studies, pulse oximetry, re-evaluation of patient's condition, obtaining history from patient or surrogate and review of old charts     ____________________________________________   INITIAL IMPRESSION / ASSESSMENT AND PLAN / ED COURSE  @[email or web address] @       Patient is a 69 year old male that presents for aphasia that began approximately 9 PM last night.  Differential diagnosis includes CVA, TIA, epilepsy, or medication overdose/alcohol intoxication.  Given patient's history, physical exam, laboratory/radiologic evaluation patient likely had an acute CVA at approximately 9 PM last night causing his aphasia as was seen on his CT noncontrast of the head.  I spoke to patient's wife who provided further insight into this patient's case and states that he has had persistent symptoms since approximately 2100 last night.  Patient will require admission to the internal medicine service for further evaluation and management.  I spoke to the on-call hospitalist who agrees with this plan as does the patient and all questions were answered.      ____________________________________________   FINAL CLINICAL IMPRESSION(S) / ED DIAGNOSES  Final diagnoses:  Aphasia  Cerebrovascular accident (CVA), unspecified mechanism [facility])     ED Discharge Orders    None       Note:  This document was prepared using Dragon voice recognition software and may include unintentional dictation errors.   [facility], MD 08/19/20 [phone]

## 2020-08-19 NOTE — H&P (Signed)
History and Physical    Darin Lang XFQ:[id number] DOB: 05/04/1951 DOA: 08/19/2020  PCP: Kandyce Rud, MD   Patient coming from: Home  I have personally briefly reviewed patient's old medical records in [facility]  Chief Complaint: Difficulty finding words  HPI: Darin Lang is a 69 y.o. 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  Patient states his symptoms started around 9 PM, the night prior to his admission when his wife noticed that he had difficulty finding the right words.  Patient states that he knows what he wants to say but has a hard time getting the words out.  He has no lower extremity or upper extremity weakness, no blurred vision, no dizziness, no lightheadedness, no difficulty swallowing.  He decided to come to the ER for evaluation when he woke up this morning and the symptoms were still present. He denies having any chest pain, no shortness of breath, no nausea, no vomiting, no abdominal pain, no changes in his bowel habits or urinary symptoms. Labs show sodium 137, potassium 3.6, chloride 101, bicarb 27, BUN 16, creatinine 1.01, calcium 9.2, AST 28, ALT 16, white count 4.7, hemoglobin 12.2, hematocrit 35.9, MCV 89.5, platelet count 232 CT scan of the head without contrast showed decreased attenuation in the anterior left thalamus raises concern for recent and potentially acute infarct in this area. No other findings suggesting potential acute infarct evident. Extensive supratentorial small vessel disease which for the most part was present previously. Suspect small vessel disease throughout these areas. Note that areas of decreased attenuation in the superior right centrum semiovale are oriented perpendicular to the lateral ventricles, a finding that potentially may represent an underlying degree of demyelination. This appearance is stable  compared to prior studies. Slight arterial vascular calcification. Twelve-lead EKG shows normal sinus rhythm   ED Course: Patient is a 70 year old African-American male with a history of a prior stroke with no focal deficits who presents to the ER for evaluation of expressive aphasia that started at least 12 hours prior to his presentation.  CT scan of the head without contrast showed an area of decreased attenuation in the left thalamus raising concern for an acute infarct.  Patient will be referred to observation status for further evaluation.  Review of Systems: As per HPI otherwise 10 point review of systems negative.    Past Medical History:  Diagnosis Date  . Hyperlipidemia   . Hypertension     Past Surgical History:  Procedure Laterality Date  . MOUTH SURGERY     salivary gland  . SUBMANDIBULAR GLAND EXCISION Right 01/29/2020   Procedure: EXCISION SUBMANDIBULAR GLAND;  Surgeon: Bud Face, MD;  Location: [facility] ORS;  Service: ENT;  Laterality: Right;     reports that he has never smoked. He has never used smokeless tobacco. He reports previous alcohol use. He reports that he does not use drugs.  No Known Allergies  No family history on file.   Prior to Admission medications   Medication Sig Start Date End Date Taking? Authorizing Provider  amoxicillin-clavulanate (AUGMENTIN) 875-125 MG tablet Take 1 tablet by mouth 2 (two) times daily. 01/30/20   Bud Face, MD  aspirin 81 MG EC tablet Take 81 mg by mouth daily.     [provider]  atorvastatin (LIPITOR) 20 MG tablet Take 20 mg by mouth daily.  01/31/16   [provider]  losartan (COZAAR) 100 MG tablet Take 100 mg by mouth daily.  01/31/16   [provider]  Multiple Vitamins-Minerals (CENTRUM) tablet Take 1 tablet by mouth daily.     [provider]  ondansetron (ZOFRAN) 4 MG tablet Take 1 tablet (4 mg total) by mouth every 8 (eight) hours as needed for up to 10 doses for  nausea or vomiting. 01/30/20   Vaught, Roney Mans, MD  senna-docusate (SENOKOT-S) 8.6-50 MG tablet Take 1 tablet by mouth 2 (two) times daily. 01/30/20   Bud Face, MD    Physical Exam: Vitals:   08/19/20 1010 08/19/20 1012 08/19/20 1013  BP:   125/77  Pulse: 65    Resp: 18    TempSrc: Oral    SpO2: 99%    Weight:  80.3 kg   Height:  5\' 7"  (1.702 m)      Vitals:   08/19/20 1010 08/19/20 1012 08/19/20 1013  BP:   125/77  Pulse: 65    Resp: 18    TempSrc: Oral    SpO2: 99%    Weight:  80.3 kg   Height:  5\' 7"  (1.702 m)     Constitutional: NAD, alert and oriented x 3 Eyes: PERRL, lids and conjunctivae pallor ENMT: Mucous membranes are moist.  Neck: normal, supple, no masses, no thyromegaly Respiratory: clear to auscultation bilaterally, no wheezing, no crackles. Normal respiratory effort. No accessory muscle use.  Cardiovascular: Regular rate and rhythm, no murmurs / rubs / gallops. No extremity edema. 2+ pedal pulses. No carotid bruits.  Abdomen: no tenderness, no masses palpated. No hepatosplenomegaly. Bowel sounds positive.  Musculoskeletal: no clubbing / cyanosis. No joint deformity upper and lower extremities.  Skin: no rashes, lesions, ulcers.  Neurologic: No gross focal neurologic deficit.  Able to move all extremities.  Expressive aphasia Psychiatric: Normal mood and affect.   Labs on Admission: I have personally reviewed following labs and imaging studies  CBC: Recent Labs  Lab 08/19/20 1220  WBC 4.7  NEUTROABS 2.6  HGB 12.2*  HCT 35.9*  MCV 89.5  PLT 232   Basic Metabolic Panel: Recent Labs  Lab 08/19/20 1220  NA 137  K 3.6  CL 101  CO2 27  GLUCOSE 92  BUN 16  CREATININE 1.01  CALCIUM 9.2   GFR: Estimated Creatinine Clearance: 72.1 mL/min (by C-G formula based on SCr of 1.01 mg/dL). Liver Function Tests: Recent Labs  Lab 08/19/20 1220  AST 28  ALT 16  ALKPHOS 54  BILITOT 1.2  PROT 7.3  ALBUMIN 4.3   No results for input(s):  LIPASE, AMYLASE in the last 168 hours. No results for input(s): AMMONIA in the last 168 hours. Coagulation Profile: Recent Labs  Lab 08/19/20 1220  INR 1.0   Cardiac Enzymes: No results for input(s): CKTOTAL, CKMB, CKMBINDEX, TROPONINI in the last 168 hours. BNP (last 3 results) No results for input(s): PROBNP in the last 8760 hours. HbA1C: No results for input(s): HGBA1C in the last 72 hours. CBG: No results for input(s): GLUCAP in the last 168 hours. Lipid Profile: No results for input(s): CHOL, HDL, LDLCALC, TRIG, CHOLHDL, LDLDIRECT in the last 72 hours. Thyroid Function Tests: No results for input(s): TSH, T4TOTAL, FREET4, T3FREE, THYROIDAB in the last 72 hours. Anemia Panel: No results for input(s): VITAMINB12, FOLATE, FERRITIN, TIBC, IRON, RETICCTPCT in the last 72 hours. Urine analysis:    Component Value Date/Time   COLORURINE Amber 03/24/2012 1751   APPEARANCEUR Clear 11/12/2016 0914   LABSPEC 1.029 03/24/2012 1751  PHURINE 5.0 03/24/2012 1751   GLUCOSEU Negative 11/12/2016 0914   GLUCOSEU Negative 03/24/2012 1751   HGBUR Negative 03/24/2012 1751   BILIRUBINUR Negative 11/12/2016 0914   BILIRUBINUR Negative 03/24/2012 1751   KETONESUR Trace 03/24/2012 1751   PROTEINUR Negative 11/12/2016 0914   PROTEINUR 30 mg/dL 16/10/960404/11/2012 [id number]   NITRITE Negative 11/12/2016 0914   NITRITE Negative 03/24/2012 1751   LEUKOCYTESUR Negative 11/12/2016 0914   LEUKOCYTESUR Negative 03/24/2012 1751    Radiological Exams on Admission: CT HEAD WO CONTRAST  Result Date: 08/19/2020 CLINICAL DATA:  Dysarthria EXAM: CT HEAD WITHOUT CONTRAST TECHNIQUE: Contiguous axial images were obtained from the base of the skull through the vertex without intravenous contrast. COMPARISON:  July 24, 2012 head CT; brain MRI January 04, 2008 FINDINGS: Brain: There is age related volume loss. There is no demonstrable intracranial mass, hemorrhage, extra-axial fluid collection, or midline shift. There is  extensive decreased attenuation throughout the centra semiovale bilaterally. Foci of decreased attenuation oriented parallel to the lateral ventricles in the right centrum semiovale remain stable. In comparison with the previous study, there is decreased attenuation in the anterior left thalamus which may represent a recent and potentially acute infarct. No other findings suggesting potential recent/acute infarct are appreciable. There is evidence what appears to be a chronic infarct in the genu of the left internal capsule, compared to previous studies. Small vessel disease is also noted in the region of the extreme capsule on the left, likely chronic. Vascular: No hyperdense vessel. Slight arterial vascular calcification noted in the centra semiovale bilaterally. Skull: The bony calvarium appears intact. Sinuses/Orbits: Visualized paranasal sinuses are clear. Orbits appear symmetric bilaterally. Other: Mastoid air cells clear. IMPRESSION: 1. Decreased attenuation in the anterior left thalamus raises concern for recent and potentially acute infarct in this area. No other findings suggesting potential acute infarct evident. 2. Extensive supratentorial small vessel disease which for the most part was present previously. Suspect small vessel disease throughout these areas. Note that areas of decreased attenuation in the superior right centrum semiovale are oriented perpendicular to the lateral ventricles, a finding that potentially may represent an underlying degree of demyelination. This appearance is stable compared to prior studies. 3.  Slight arterial vascular calcification. Electronically Signed   By: Bretta BangWilliam  Woodruff III M.D.   On: 08/19/2020 11:46    EKG: Independently reviewed.  Normal sinus rhythm  Assessment/Plan Principal Problem:   Acute CVA (cerebrovascular accident) [facility](HCC) Active Problems:   Benign essential hypertension   Hyperlipidemia    Acute CVA Patient with a history of a prior  stroke with no focal deficits who presents to the ER for evaluation of expressive aphasia that he has had for at least 12 hours prior to his presentation Initial CT scan of the head done without contrast shows an area of decreased attenuation in the anterior left thalamus which may represent an acute infarct We will obtain MRI of the brain without contrast for further evaluation Obtain carotid Doppler and 2D echocardiogram to assess LVEF and rule out cardiac thrombus Place patient on aspirin 325 mg daily and high intensity statins Allow for permissive hypertension We will request PT/OT/speech therapy consult We will consult neurology   Hypertension Hold Cozaar for now Allow for permissive hypertension since patient has an acute stroke   Dyslipidemia Continue atorvastatin but increase dose to 80 mg Obtain lipid profile  DVT prophylaxis: SCD Code Status: Full code Family Communication: Greater than 50% of time was spent discussing patient's condition and plan of care  with him at the bedside.  All questions and concerns have been addressed.  He verbalizes understanding and agrees to the plan. Disposition Plan: Back to previous home environment Consults called: Neurology    Darin Shutters MD [facility]     08/19/2020, 2:29 PM

## 2020-08-20 ENCOUNTER — Encounter: Payer: Self-pay | Admitting: Internal Medicine

## 2020-08-20 ENCOUNTER — Observation Stay: Payer: Medicare HMO

## 2020-08-20 DIAGNOSIS — E785 Hyperlipidemia, unspecified: Secondary | ICD-10-CM | POA: Diagnosis not present

## 2020-08-20 DIAGNOSIS — I10 Essential (primary) hypertension: Secondary | ICD-10-CM | POA: Diagnosis not present

## 2020-08-20 LAB — URINE DRUG SCREEN, QUALITATIVE (ARMC ONLY)
Amphetamines, Ur Screen: NOT DETECTED
Barbiturates, Ur Screen: NOT DETECTED
Benzodiazepine, Ur Scrn: NOT DETECTED
Cannabinoid 50 Ng, Ur ~~LOC~~: NOT DETECTED
Cocaine Metabolite,Ur ~~LOC~~: NOT DETECTED
MDMA (Ecstasy)Ur Screen: NOT DETECTED
Methadone Scn, Ur: NOT DETECTED
Opiate, Ur Screen: NOT DETECTED
Phencyclidine (PCP) Ur S: NOT DETECTED
Tricyclic, Ur Screen: NOT DETECTED

## 2020-08-20 LAB — URINALYSIS, ROUTINE W REFLEX MICROSCOPIC
Bilirubin Urine: NEGATIVE
Glucose, UA: NEGATIVE mg/dL
Hgb urine dipstick: NEGATIVE
Ketones, ur: NEGATIVE mg/dL
Leukocytes,Ua: NEGATIVE
Nitrite: NEGATIVE
Protein, ur: NEGATIVE mg/dL
Specific Gravity, Urine: 1.019 (ref 1.005–1.030)
pH: 5 (ref 5.0–8.0)

## 2020-08-20 LAB — LIPID PANEL
Cholesterol: 119 mg/dL (ref 0–200)
HDL: 37 mg/dL — ABNORMAL LOW (ref >40)
LDL Cholesterol: 67 mg/dL (ref 0–99)
Total CHOL/HDL Ratio: 3.2 RATIO
Triglycerides: 73 mg/dL (ref <150)
VLDL: 15 mg/dL (ref 0–40)

## 2020-08-20 LAB — ECHOCARDIOGRAM COMPLETE
Area-P 1/2: 2.5 cm2
Height: 67 in
S' Lateral: 2.23 cm
Weight: 2832 oz

## 2020-08-20 LAB — HEMOGLOBIN A1C
Hgb A1c MFr Bld: 5.8 % — ABNORMAL HIGH (ref 4.8–5.6)
Mean Plasma Glucose: 119.76 mg/dL

## 2020-08-20 MED ORDER — ASPIRIN 325 MG PO TABS: 325.0000 mg | ORAL_TABLET | Freq: Every day | ORAL | 0 refills | Status: AC

## 2020-08-20 MED ORDER — ATORVASTATIN CALCIUM 80 MG PO TABS: 80.0000 mg | ORAL_TABLET | Freq: Every day | ORAL | 0 refills | Status: AC

## 2020-08-20 NOTE — Evaluation (Addendum)
Speech Language Pathology Evaluation Patient Details Name: Darin Lang MRN: [id number] DOB: [birth year] Today's Date: 08/20/2020 Time: 1749-4496 SLP Time Calculation (min) (ACUTE ONLY): 60 min  Problem List:  Patient Active Problem List   Diagnosis Date Noted  . Acute CVA (cerebrovascular accident) (HCC) 08/19/2020  . H/O submandibular gland removal 01/29/2020  . Benign essential hypertension 01/19/2018  . Hyperlipidemia 01/19/2018   Past Medical History:  Past Medical History:  Diagnosis Date  . Hyperlipidemia   . Hypertension    Past Surgical History:  Past Surgical History:  Procedure Laterality Date  . MOUTH SURGERY     salivary gland  . SUBMANDIBULAR GLAND EXCISION Right 01/29/2020   Procedure: EXCISION SUBMANDIBULAR GLAND;  Surgeon: Bud Face, MD;  Location: [facility] ORS;  Service: ENT;  Laterality: Right;   HPI:  Darin Lang is a 69 y.o. male with medical history significant for CVA w/ no focal deficits, hypertension and dyslipidemia who came into the ER via private vehicle for evaluation of word finding difficulties. Darin Lang states his symptoms started around 9 PM, the night prior to his admission when his wife noticed that he had difficulty finding the right words. Darin Lang states that he knows what he wants to say but has a hard time getting the words out.  He has no lower extremity or upper extremity weakness, no blurred vision, no dizziness, no lightheadedness, no difficulty swallowing.  He decided to come to the ER for evaluation when he woke up this morning and the symptoms were still present. MRI (08/19/20): 1.5 cm acute ischemic nonhemorrhagic left thalamic infarct.    Assessment / Plan / Recommendation Clinical Impression  Darin Lang was administered the Western Aphasia Battery bedside screening tool; distractions were minimized as able. Darin Lang was A&O x4. Darin Lang appears to present w/ min-mod impaired cognitive-linguistic function w/ apparent expressive & receptive aphasia noted during this screening s/p  acute CVA (see MRI results). Results found during the screening: Spontaneous Speech Content (9/10); Spontaneous Speech Fluency (5/10); Auditory Verbal Comprehension (Y/N questions--10/10); Sequential Commands (8/10); Repetition (10/10); Object Naming (9/10); Reading WFL; Oral Apraxia (10/10). Per wife (and Darin Lang), Darin Lang presents w/ increased difficulty w/ word finding during conversation, and presents w/ increased circumlocution & perseveration. During Spontaneous Speech Content task Darin Lang initially reported an outdated address; was able to produce correct address w/ min verbal cueing from wife. Verbal picture description task revealed Darin Lang's ability to fully scan image from L-R: no unilateral neglect or visual cutting suspected at this time. Spontaneous Speech Fluency rated at 5/10-- speech halting & paraphasic, but more complete sentences w/ significant word-finding difficulty noted. During Sequential Command task Darin Lang's accuracy was increased w/ more simple commands; accuracy decreased as complexity of commands increased. Confrontation naming of objects min impaired: Darin Lang demonstrated semantic & phonemic paraphasias during object naming task 2x given 10 stimuli. Darin Lang was able to produce target word during object naming task following semantic paraphasia given min-mod phonemic & semantic cues from clinician. Compare/contrast two objects task elicited by clinician revealed increased difficulty w/ ability to recall/retain spoken directions/instruction--evidenced by need for clinician to repeat directions for task, and branch down to prompt for information in smaller increments w/ increased cueing. Darin Lang also demonstrated some semantic paraphasias during compare/contrast tasks, which required semantic cueing from clinician. Unsure of extent impact Darin Lang's working memory, attention & ability to recall verbal information/instructions has on performance of this task vs. pure word finding difficulty/expressive deficits. Summarily, Darin Lang's ability  to relay basic information was generally observed to be [facility]; deficit severity increased as  complexity of tasks and verbal information increased. During writing task Darin Lang was able to report name, address, transcribe a verbal sentence, and describe a stimulus photo in two sentences w/ 100% accuracy demonstrating visual language strength. Darin Lang's WFL writing ability may improve prognosis for integration of potential compensatory strategy for word-finding difficulties (ability to utilize multi-modal communication). Oral-motor exam revealed adequate tongue protrusion/posteriorstrength, &ROM. No labial weakness or decreased tone noted. Face observed to be symmetrical. No oral apraxia suspected. Darin Lang is currently on a regular diet.  Darin Lang & wife consulted re goals & continuation of SLP services; indicated interest in continuation of SLP services foraddressing Aphasia & any other cognitive-linguistic deficits--increasing word-finding abilities &overall fluency of speech for functional use in ADL's & communication w/ others.Darin Lang & wife were given education on communication strategies. Recommend ongoing formal assessment & targeting expressive/receptive language deficits upon discharge. Updated social worker/NSG.    SLP Assessment  SLP Recommendation/Assessment: Patient needs continued Speech Lanaguage Pathology Services (outpatient) SLP Visit Diagnosis: Aphasia (R47.01)    Follow Up Recommendations  Outpatient SLP;Home health SLP    Frequency and Duration TBD at next venue of care     SLP Evaluation Cognition  Overall Cognitive Status: Within Functional Limits for tasks assessed Arousal/Alertness: Awake/alert Orientation Level: Oriented X4 Attention: Focused;Sustained;Alternating;Selective;Divided Focused Attention: Appears intact Sustained Attention: Appears intact Selective Attention: Appears intact Alternating Attention: Appears intact Divided Attention: Appears intact Memory: Appears intact Memory  Recall Sock:  (NT) Memory Recall Blue:  (NT) Memory Recall Bed:  (NT) Awareness: Appears intact Problem Solving: Appears intact Executive Function: Reasoning;Sequencing;Self Monitoring Reasoning: Appears intact Sequencing: Appears intact Self Monitoring: Appears intact Behaviors: Perseveration Safety/Judgment: Appears intact       Comprehension  Auditory Comprehension Overall Auditory Comprehension: Appears within functional limits for tasks assessed Yes/No Questions: Within Functional Limits Commands: Impaired (min impaired w/ increasing complexity) Conversation: Complex Other Conversation Comments: perseveration, semantic & phonemic paraphasias, some circumlocution EffectiveTechniques: Extra processing time;Visual/Gestural cues (semantic & phonemic cues) Reading Comprehension Reading Status: Within funtional limits    Expression Expression Primary Mode of Expression: Verbal Verbal Expression Overall Verbal Expression: Impaired Initiation: No impairment Automatic Speech: Name;Social Response;Counting;Day of week (all WFL) Level of Generative/Spontaneous Verbalization: Conversation Repetition: No impairment Naming: Impairment (mild impairment) Responsive: 51-75% accurate Confrontation: Within functional limits Convergent: Not tested Divergent: Not tested Verbal Errors: Semantic paraphasias;Phonemic paraphasias;Perseveration;Aware of errors Pragmatics: No impairment Effective Techniques: Phonemic cues;Semantic cues;Sentence completion Non-Verbal Means of Communication: Writing (writing WFL-- possible compensatory strategy for [facility]) Written Expression Dominant Hand: Right Written Expression: Within Functional Limits   Oral / Motor  Oral Motor/Sensory Function Overall Oral Motor/Sensory Function: Within functional limits Motor Speech Overall Motor Speech: Appears within functional limits for tasks assessed Respiration: Within functional limits Resonance: Within  functional limits Motor Planning: Witnin functional limits   GO                    Oliver Pila 08/20/2020, 11:33 AM   The information in this patient note, response to treatment, and overall treatment plan developed has been reviewed and agreed upon by this clinician.  Jerilynn Som, MS, CCC-SLP Speech Language Pathologist [facility] [phone] 08/20/20,3:38 PM

## 2020-08-20 NOTE — Plan of Care (Signed)
Pt admitted this shift. Pt still disoriented to year, but can now state who the president is. No other deficits noted. Problem: Education: Goal: Knowledge of disease or condition will improve Outcome: Progressing Goal: Knowledge of secondary prevention will improve Outcome: Progressing Goal: Knowledge of patient specific risk factors addressed and post discharge goals established will improve Outcome: Progressing   Problem: Ischemic Stroke/TIA Tissue Perfusion: Goal: Complications of ischemic stroke/TIA will be minimized Outcome: Progressing

## 2020-08-20 NOTE — Discharge Summary (Signed)
Physician Discharge Summary  Patient ID: Darin Lang MRN: [id number] DOB/AGE: 02-12-1951 69 y.o.  Admit date: 08/19/2020 Discharge date: 08/20/2020  Admission Diagnoses:  Discharge Diagnoses:  Principal Problem:   Acute CVA (cerebrovascular accident) [facility]) Active Problems:   Benign essential hypertension   Hyperlipidemia   Discharged Condition: good  Hospital Course:  Darin Lang is a 69 y.o. 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  Patient states his symptoms started around 9 PM, the night prior to his admission when his wife noticed that he had difficulty finding the right words.  He does not have a significant weakness CT scan of the head showed decreased attenuation in the anterior left thalamic area.  MRI confirmed a 1.5 cm infarct in the same area.  His LDL is 67.  Carotid ultrasound did not show any significant stenosis.  Brain MRI did not show significant occlusion.  At this point, patient symptom has much improved.  Is medically stable to be discharged.  Discussed with neurology, patient be treated with high-dose aspirin 325 mg daily.  Patient follow-up with neurology as outpatient.   Consults: neurology  Significant Diagnostic Studies:  Echo: 1. Left ventricular ejection fraction, by estimation, is 60 to 65%. The left ventricle has normal function. The left ventricle has no regional wall motion abnormalities. Left ventricular diastolic parameters were normal. 2. Right ventricular systolic function is normal. The right ventricular size is normal. 3. The mitral valve is normal in structure. Trivial mitral valve regurgitation. 4. The aortic valve is normal in structure. Aortic valve regurgitation is trivial.  BILATERAL CAROTID DUPLEX ULTRASOUND  TECHNIQUE: Pandit scale imaging, color Doppler and duplex ultrasound were performed of bilateral  carotid and vertebral arteries in the neck.  COMPARISON:  Duplex 01/04/2008  FINDINGS: Criteria: Quantification of carotid stenosis is based on velocity parameters that correlate the residual internal carotid diameter with NASCET-based stenosis levels, using the diameter of the distal internal carotid lumen as the denominator for stenosis measurement.  The following velocity measurements were obtained:  RIGHT  ICA:  Systolic 123 cm/sec, Diastolic 33 cm/sec  CCA:  120 cm/sec  SYSTOLIC ICA/CCA RATIO:  1.1  ECA:  100 cm/sec  LEFT  ICA:  Systolic 116 cm/sec, Diastolic 41 cm/sec  CCA:  130 cm/sec  SYSTOLIC ICA/CCA RATIO:  1.0  ECA:  84 cm/sec  Right Brachial SBP: Not acquired  Left Brachial SBP: Not acquired  RIGHT CAROTID ARTERY: No significant calcified disease of the right common carotid artery. Intermediate waveform maintained. Homogeneous plaque without significant calcifications at the right carotid bifurcation. Low resistance waveform of the right ICA. No significant tortuosity.  RIGHT VERTEBRAL ARTERY: Antegrade flow with low resistance waveform.  LEFT CAROTID ARTERY: No significant calcified disease of the left common carotid artery. Intermediate waveform maintained. Homogeneous plaque at the left carotid bifurcation without significant calcifications. Low resistance waveform of the left ICA.  LEFT VERTEBRAL ARTERY:  Antegrade flow with low resistance waveform.  IMPRESSION: Color duplex indicates minimal homogeneous plaque, with no hemodynamically significant stenosis by duplex criteria in the extracranial cerebrovascular circulation.  Signed,  Yvone Neu. Reyne Dumas, RPVI  [facility]  [facility]   Electronically Signed   By: Gilmer Mor D.O.   On: 08/19/2020 17:44  MRI HEAD WITHOUT CONTRAST  TECHNIQUE: Multiplanar, multiecho pulse sequences of the brain and  surrounding structures were obtained without intravenous contrast.  COMPARISON:  Prior CT from earlier the same day.  FINDINGS: Brain: Diffuse prominence of the CSF containing spaces compatible with generalized cerebral atrophy. Extensive patchy and confluent T2/FLAIR hyperintensity within the periventricular, deep, and subcortical white matter both cerebral hemispheres, nonspecific, but most commonly related to chronic microvascular ischemic disease. Multiple scattered superimposed remote lacunar infarcts present about the deep gray nuclei, hemispheric cerebral white matter, and pons.  1.5 cm focus of restricted diffusion seen involving the ventral left thalamus, compatible with an acute ischemic infarct (series 5, image 24). No associated hemorrhage or mass effect. No other evidence for acute or subacute ischemia. Gray-white matter differentiation otherwise maintained. No acute intracranial hemorrhage. Scattered chronic hemosiderin staining noted about a few of the remote basal ganglia lacunar infarcts.  No mass lesion, midline shift or mass effect. No hydrocephalus or extra-axial fluid collection. Pituitary gland suprasellar region normal. Midline structures intact.  Vascular: Major intracranial vascular flow voids are maintained.  Skull and upper cervical spine: Craniocervical junction within normal limits. Bone marrow signal intensity normal. No scalp soft tissue abnormality.  Sinuses/Orbits: Globes and orbital soft tissues within normal limits. Paranasal sinuses are largely clear. No mastoid effusion.  Other: None.  IMPRESSION: 1. 1.5 cm acute ischemic nonhemorrhagic left thalamic infarct. 2. Underlying age-related cerebral atrophy with advanced cerebral white matter disease, nonspecific, but most commonly related to chronic microvascular ischemic disease.   Electronically Signed   By: Rise Mu M.D.   On: 08/19/2020 18:53  MRA HEAD WITHOUT  CONTRAST  TECHNIQUE: Angiographic images of the Circle of Willis were obtained using MRA technique without intravenous contrast.  COMPARISON:  Brain MRI 08/19/2020. Carotid artery duplex 08/19/2020.  FINDINGS: The intracranial internal carotid arteries are patent. The M1 middle cerebral arteries are patent without significant stenosis. No M2 proximal branch occlusion or high-grade proximal stenosis is identified. The anterior cerebral arteries are patent.  The visualized intracranial vertebral arteries are patent. The basilar artery is patent. The posterior cerebral arteries are patent proximally without significant stenosis. Sizable posterior communicating arteries are present bilaterally.  No intracranial aneurysm is identified.  IMPRESSION: No intracranial large vessel occlusion or proximal high-grade arterial stenosis.   Electronically Signed   By: Jackey Loge DO   On: 08/20/2020 13:23     Treatments: Stroke work-up.  Discharge Exam: Blood pressure 94/66, pulse 71, temperature 98.1 F (36.7 C), temperature source Oral, resp. rate 16, height 5\' 7"  (1.702 m), weight 80.3 kg, SpO2 100 %. General appearance: alert and cooperative Resp: clear to auscultation bilaterally Cardio: regular rate and rhythm, S1, S2 normal, no murmur, click, rub or gallop GI: soft, non-tender; bowel sounds normal; no masses,  no organomegaly Extremities: extremities normal, atraumatic, no cyanosis or edema  Neuro: Speech has improved, no significant weakness today  Disposition: Discharge disposition: 01-Home or Self Care       Discharge Instructions    Diet - low sodium heart healthy   Complete by: As directed    Increase activity slowly   Complete by: As directed      Allergies as of 08/20/2020   No Known Allergies     Medication List    STOP taking these medications   aspirin 81 MG EC tablet Replaced by: aspirin 325 MG tablet     TAKE these medications   aspirin  325 MG tablet Take 1 tablet (325 mg total) by mouth daily. Start taking on: August 21, 2020 Replaces: aspirin 81 MG EC tablet   atorvastatin 80 MG tablet Commonly known  as: LIPITOR Take 1 tablet (80 mg total) by mouth daily. Start taking on: August 21, 2020 What changed:   medication strength  how much to take   Centrum tablet Take 1 tablet by mouth daily.   losartan 25 MG tablet Commonly known as: COZAAR Take 25 mg by mouth daily.       Follow-up Information    Kandyce Rud, MD Follow up in 1 week(s).   Specialty: Family Medicine Contact information: [address] [facility] [place] [address] [phone]        [facility] Follow up in 1 week(s).   Contact information: [address] [place] [address] [phone]              Signed: Marrion Coy 08/20/2020, 3:35 PM

## 2020-08-20 NOTE — Progress Notes (Signed)
Discharge summary reviewed with patient and spouse. Answered all questions. Spouse stated he would eat dinner before leaving, she will let staff know.

## 2020-09-05 ENCOUNTER — Other Ambulatory Visit: Payer: Medicare HMO | Attending: Internal Medicine

## 2020-09-09 ENCOUNTER — Ambulatory Visit: Payer: Medicare HMO | Attending: Family Medicine | Admitting: Speech Pathology

## 2020-09-09 ENCOUNTER — Other Ambulatory Visit: Payer: Self-pay

## 2020-09-09 DIAGNOSIS — R41841 Cognitive communication deficit: Secondary | ICD-10-CM | POA: Diagnosis present

## 2020-09-09 DIAGNOSIS — I69923 Fluency disorder following unspecified cerebrovascular disease: Secondary | ICD-10-CM | POA: Diagnosis present

## 2020-09-10 ENCOUNTER — Encounter: Payer: Self-pay | Admitting: Speech Pathology

## 2020-09-10 ENCOUNTER — Other Ambulatory Visit: Payer: Self-pay

## 2020-09-10 NOTE — Therapy (Signed)
[place] [facility] MAIN [facility] SERVICES [address] [place], [place], [address] Phone: [phone]   Fax:  [phone]  Speech Language Pathology Evaluation  Patient Details  Name: Darin Lang MRN: [id number] Date of Birth: Aug 13, 1951 Referring Provider (SLP): Dr. Larwance Sachs   Encounter Date: 09/09/2020   End of Session - 09/10/20 1533    Visit Number 1    Number of Visits 17    Date for SLP Re-Evaluation 11/06/20    Authorization Type Medicare    Authorization Time Period 09/09/2020    Progress Note Due on Visit 10    SLP Start Time 1000    SLP Stop Time  1045    SLP Time Calculation (min) 45 min    Activity Tolerance Patient tolerated treatment well           Past Medical History:  Diagnosis Date  . Hyperlipidemia   . Hypertension     Past Surgical History:  Procedure Laterality Date  . MOUTH SURGERY     salivary gland  . SUBMANDIBULAR GLAND EXCISION Right 01/29/2020   Procedure: EXCISION SUBMANDIBULAR GLAND;  Surgeon: Bud Face, MD;  Location: [facility] ORS;  Service: ENT;  Laterality: Right;    There were no vitals filed for this visit.       SLP Evaluation [facility] - 09/10/20 0001      SLP Visit Information   SLP Received On 09/09/20    Referring Provider (SLP) Dr. Larwance Sachs    Onset Date 08/19/2020    Medical Diagnosis CVA      Subjective   Subjective Patient and spouse state that his leisure activities, such as working in the yard, have not been affected by his stroke. He enjoys completing puzzles in his spare time. Patient states that his speech is what bothers him the most after his stroke, and he also experiences some moments of "forgetfulness". Disfluencies that are likely neurogenic stuttering have only occurred since his stroke. He finds that some things he says do not make sense.      General Information   HPI NR is a 69 year old man with a history of left thalamic thalamic infarct 08/19/2020. Imaging shows signs of underlying  age related cerebral atrophy, most commonly related to chronic microvascular ischemic disease. The patient has never received outpatient speech therapy. The patient and his wife both report a decrease in memory skills and change in speech since the patient's stroke.      Prior Functional Status   Cognitive/Linguistic Baseline Within functional limits      Pain Assessment   Pain Assessment No/denies pain      Cognition   Overall Cognitive Status Impaired/Different from baseline      Auditory Comprehension   Overall Auditory Comprehension Appears within functional limits for tasks assessed      Reading Comprehension   Reading Status Within funtional limits      Expression   Primary Mode of Expression Verbal      Verbal Expression   Overall Verbal Expression Impaired      Written Expression   Written Expression Within Functional Limits      Oral Motor/Sensory Function   Overall Oral Motor/Sensory Function Appears within functional limits for tasks assessed      Motor Speech   Overall Motor Speech Appears within functional limits for tasks assessed      Standardized Assessments   Standardized Assessments  Cognitive Linguistic Quick Test           Standardized Testing  Results  Patient's scores for the domains of the CLQT are as follows:  Attention: 187 (WNL)  Memory: 146 (Mild)  Executive Functions: 31 (WNL)  Language: 29 (WNL)  Visuospatial Skills: 95 (WNL)  Composite severity rating for age range = 3.8, which falls into WNL. Memory domain includes personal facts, story retelling, generative naming, and design memory.    Pertinent Observations     SLP Education - 09/10/20 1532    Education Details Results and recommendations    Person(s) Educated Patient;Spouse    Methods Explanation    Comprehension Verbalized understanding              SLP Long Term Goals - 09/10/20 1536      SLP LONG TERM GOAL #1   Title Pt will improve speech intelligibility  and fluency in response to moderately complex cognitive linguistic tasks using constant flow phonation, controlled rate of speech, over-articulation, and increased loudness to achieve 80% or better intelligibility/fluency.    Time 8    Period Weeks    Status New    Target Date 11/06/20      SLP LONG TERM GOAL #2   Title Patient will demonstrate functional cognitive-communication skills for independent completion of personal responsibilities and leisure activities.    Time 8    Period Weeks    Status New    Target Date 11/06/20      SLP LONG TERM GOAL #3   Title Patient will complete complex memory activities with 80% accuracy.    Time 8    Period Weeks    Status New    Target Date 11/06/20            Plan - 09/10/20 1535    Clinical Impression Statement This 69 year old man, with history of a left thalamic stroke 08/19/2020 and underlying age related cerebral atrophy, is presented with overall cognitive function that is WNL, but with mild deficits in memory. The results of the Cognitive Linguistic Quick Test (CLQT) indicate a composite severity rating that is WNL. The patient scored WNL within the domains of attention, executive functions, language, and visuospatial skills and scored mild within the domain of memory particularly in the areas of recalling personal facts and story retelling. These results represent a slight cognitive decline since his stroke. When given a short paragraph, the patient experienced some moments of disfluencies and answered all questions accurately when given the passage to refer back to. Patient's writing is legible and utilizes proper grammar and punctuation. When given a picture to describe, the patient was accurate in his descriptions and provided further detail when prompted. 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 The patient will benefit from skilled speech therapy for restorative and compensatory  treatment of deficits.    Speech Therapy Frequency 2x / week    Duration 8 weeks    Treatment/Interventions Language facilitation;Cognitive reorganization;SLP instruction and feedback;Patient/family education    Potential to Achieve Goals Good    Potential Considerations Ability to learn/carryover information;Severity of impairments;Co-morbidities;Pain level;Cooperation/participation level;Previous level of function;Family/community support    Consulted and Agree with Plan of Care Patient;Family member/caregiver    Family Member Consulted Spouse           Patient will benefit from skilled therapeutic intervention in order to improve the following deficits and impairments:   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 Acute CVA (cerebrovascular accident) (HCC) 08/19/2020  . H/O submandibular gland removal 01/29/2020  . Benign essential hypertension 01/19/2018  . Hyperlipidemia 01/19/2018   Dollene Primrose, MS/CCC- SLP  Leandrew Koyanagi 09/10/2020, 3:46 PM  [facility] MAIN [facility] SERVICES [address] [place], [place], [address] Phone: [phone]   Fax:  [phone]  Name: Darin Lang MRN: [id number] Date of Birth: Jan 09, 1951

## 2020-09-15 ENCOUNTER — Ambulatory Visit: Payer: Medicare HMO | Attending: Family Medicine | Admitting: Speech Pathology

## 2020-09-15 ENCOUNTER — Encounter: Payer: Self-pay | Admitting: Speech Pathology

## 2020-09-15 ENCOUNTER — Other Ambulatory Visit: Payer: Self-pay

## 2020-09-15 DIAGNOSIS — I639 Cerebral infarction, unspecified: Secondary | ICD-10-CM | POA: Diagnosis not present

## 2020-09-15 DIAGNOSIS — R41841 Cognitive communication deficit: Secondary | ICD-10-CM | POA: Diagnosis present

## 2020-09-15 NOTE — Therapy (Signed)
[place] MAIN [facility] SERVICES [address] [place], [place], [address] Phone: [phone]   Fax:  [phone]  Speech Language Pathology Treatment  Patient Details  Name: Darin Lang MRN: [id number] Date of Birth: 02-Oct-1951 Referring Provider (SLP): Dr. Larwance Sachs   Encounter Date: 09/15/2020   End of Session - 09/15/20 1246    Visit Number 2    Number of Visits 17    Date for SLP Re-Evaluation 11/06/20    Authorization Type Medicare    Authorization Time Period 09/09/2020    Progress Note Due on Visit 10    SLP Start Time 1000    SLP Stop Time  1050    SLP Time Calculation (min) 50 min    Activity Tolerance Patient tolerated treatment well           Past Medical History:  Diagnosis Date  . Hyperlipidemia   . Hypertension     Past Surgical History:  Procedure Laterality Date  . MOUTH SURGERY     salivary gland  . SUBMANDIBULAR GLAND EXCISION Right 01/29/2020   Procedure: EXCISION SUBMANDIBULAR GLAND;  Surgeon: Bud Face, MD;  Location: [facility] ORS;  Service: ENT;  Laterality: Right;    There were no vitals filed for this visit.   Subjective Assessment - 09/15/20 1245    Subjective Pt pleasant and cooperative with unfamiliar therapist. Wife in attendance    Patient is accompained by: Family member    Currently in Pain? No/denies                 ADULT SLP TREATMENT - 09/15/20 0001      General Information   Behavior/Cognition Alert;Cooperative;Pleasant mood    HPI 69yo male      Treatment Provided   Treatment provided Cognitive-Linquistic      Pain Assessment   Pain Assessment No/denies pain      Cognitive-Linquistic Treatment   Treatment focused on Cognition;Patient/family/caregiver education    Skilled Treatment Pt was seen for skilled ST intervention targeting goals for cognition and communication. Pt reports difficulty with recall of family members names, and his wife reports pt is doing things he has  "never done before", including putting dish soap in the dishwasher, leaving yard tools outside, and emptying trash can without removing the bag. SLP discussed these actions as possibly due to decreased attention, and encouraged pt/wife to be cautious with these activities. Pt also reports difficulty with word finding, and was encouraged to discuss places/events, etc or play "I spy" with his 44 year old grand nephew to facilitate word retrieval and effective descriptions. Pt/wife were encouraged to write information in a central location and establish routines to increase functional recall.       Assessment / Recommendations / Plan   Plan Continue with current plan of care      Progression Toward Goals   Progression toward goals Progressing toward goals            SLP Education - 09/15/20 1245    Education Details SLP reviewed test results from evaluation and discussed goals established.    Person(s) Educated Patient;Spouse    Methods Explanation;Verbal cues;Demonstration    Comprehension Verbalized understanding;Need further instruction              SLP Long Term Goals - 09/10/20 1536      SLP LONG TERM GOAL #1   Title Pt will improve speech intelligibility and fluency in response to moderately complex cognitive linguistic tasks using constant flow  phonation, controlled rate of speech, over-articulation, and increased loudness to achieve 80% or better intelligibility/fluency.    Time 8    Period Weeks    Status New    Target Date 11/06/20      SLP LONG TERM GOAL #2   Title Patient will demonstrate functional cognitive-communication skills for independent completion of personal responsibilities and leisure activities.    Time 8    Period Weeks    Status New    Target Date 11/06/20      SLP LONG TERM GOAL #3   Title Patient will complete complex memory activities with 80% accuracy.    Time 8    Period Weeks    Status New    Target Date 11/06/20            Plan -  09/15/20 1247    Clinical Impression Statement Pt and wife were receptive to SLP education regarding CLQT results and recommendations. Pt will benefit from continued ST intervention to maximize functional recall for increased independence and reduced caregiver burden.    Speech Therapy Frequency 2x / week    Duration 8 weeks    Treatment/Interventions Language facilitation;Cognitive reorganization;SLP instruction and feedback;Patient/family education    Potential to Achieve Goals Good    Potential Considerations Ability to learn/carryover information;Severity of impairments;Co-morbidities;Pain level;Cooperation/participation level;Previous level of function;Family/community support    SLP Home Exercise Plan Pt was encouraged to write down information to facilitate recall. Pt/wife were encouraged to talk about events/places to maximize word finding skills.    Consulted and Agree with Plan of Care Patient;Family member/caregiver    Family Member Consulted Spouse           Patient will benefit from skilled therapeutic intervention in order to improve the following deficits and impairments:   Acute CVA (cerebrovascular accident) [facility])    Problem List Patient Active Problem List   Diagnosis Date Noted  . Acute CVA (cerebrovascular accident) (HCC) 08/19/2020  . H/O submandibular gland removal 01/29/2020  . Benign essential hypertension 01/19/2018  . Hyperlipidemia 01/19/2018   Genecis Veley B. Murvin Natal, [facility], CCC-SLP Speech Language Pathologist  [name] 09/15/2020, 1:30 PM  [place] [facility] MAIN [facility] SERVICES [address] [place], [place], [address] Phone: [phone]   Fax:  [phone]   Name: Darin Lang MRN: [id number] Date of Birth: 17-Jul-1951

## 2020-09-17 ENCOUNTER — Encounter: Payer: Self-pay | Admitting: Speech Pathology

## 2020-09-17 ENCOUNTER — Ambulatory Visit: Payer: Medicare HMO | Admitting: Speech Pathology

## 2020-09-17 ENCOUNTER — Other Ambulatory Visit: Payer: Self-pay

## 2020-09-17 DIAGNOSIS — I639 Cerebral infarction, unspecified: Secondary | ICD-10-CM | POA: Diagnosis not present

## 2020-09-17 DIAGNOSIS — R41841 Cognitive communication deficit: Secondary | ICD-10-CM

## 2020-09-17 NOTE — Therapy (Signed)
[place] [facility] MAIN [facility] SERVICES [address] [place], [place], [address] Phone: [phone]   Fax:  [phone]  Speech Language Pathology Treatment  Patient Details  Name: Darin Lang MRN: [id number] Date of Birth: Apr 10, 1951 Referring Provider (SLP): Dr. Larwance Sachs   Encounter Date: 09/17/2020   End of Session - 09/17/20 1315    Visit Number 3    Number of Visits 17    Date for SLP Re-Evaluation 11/06/20    Authorization Type Medicare    Authorization Time Period 09/09/2020    Progress Note Due on Visit 10    SLP Start Time 1000    SLP Stop Time  1050    SLP Time Calculation (min) 50 min    Activity Tolerance Patient tolerated treatment well           Past Medical History:  Diagnosis Date  . Hyperlipidemia   . Hypertension     Past Surgical History:  Procedure Laterality Date  . MOUTH SURGERY     salivary gland  . SUBMANDIBULAR GLAND EXCISION Right 01/29/2020   Procedure: EXCISION SUBMANDIBULAR GLAND;  Surgeon: Bud Face, MD;  Location: [facility] ORS;  Service: ENT;  Laterality: Right;    There were no vitals filed for this visit.   Subjective Assessment - 09/17/20 1312    Subjective Pt and wife participated in session.    Patient is accompained by: Family member    Currently in Pain? No/denies                 ADULT SLP TREATMENT - 09/17/20 0001      General Information   Behavior/Cognition Alert;Cooperative;Pleasant mood    HPI 69yo male      Treatment Provided   Treatment provided Cognitive-Linquistic      Cognitive-Linquistic Treatment   Treatment focused on Cognition;Patient/family/caregiver education    Skilled Treatment Pt was seen for skilled ST intervention targeting goals for improved functional recall and independence. SLP facilitated session by reviewing memory strategies including WRAP, suggestions including sticky notes, consistency of location, use of portable calendar.  Pt required mod-max verbal  and visual cues to complete simple multiprocess reasoning task (from [employer]), with cues to attend to detail, problem solve, and reason. Recommend continued use of this activity in future treatments.       Assessment / Recommendations / Plan   Plan Continue with current plan of care      Progression Toward Goals   Progression toward goals Progressing toward goals            SLP Education - 09/17/20 1314    Education Details Compensatory strategy for functional recall via small calendar    Person(s) Educated Patient    Methods Explanation;Demonstration;Verbal cues;Handout    Comprehension Verbalized understanding;Need further instruction              SLP Long Term Goals - 09/10/20 1536      SLP LONG TERM GOAL #1   Title Pt will improve speech intelligibility and fluency in response to moderately complex cognitive linguistic tasks using constant flow phonation, controlled rate of speech, over-articulation, and increased loudness to achieve 80% or better intelligibility/fluency.    Time 8    Period Weeks    Status New    Target Date 11/06/20      SLP LONG TERM GOAL #2   Title Patient will demonstrate functional cognitive-communication skills for independent completion of personal responsibilities and leisure activities.    Time 8  Period Weeks    Status New    Target Date 11/06/20      SLP LONG TERM GOAL #3   Title Patient will complete complex memory activities with 80% accuracy.    Time 8    Period Weeks    Status New    Target Date 11/06/20            Plan - 09/17/20 1316    Clinical Impression Statement Pt and wife are both receptive to education regarding compensatory strategies. Continued ST intervention using multiprocess reasoning tasks is recommended.    Speech Therapy Frequency 2x / week    Duration 8 weeks    Treatment/Interventions Language facilitation;Cognitive reorganization;SLP instruction and feedback;Patient/family education    Potential to  Achieve Goals Good    Potential Considerations Ability to learn/carryover information;Severity of impairments;Co-morbidities;Pain level;Cooperation/participation level;Previous level of function;Family/community support    SLP Home Exercise Plan Provided    Consulted and Agree with Plan of Care Patient;Family member/caregiver    Family Member Consulted Spouse           Patient will benefit from skilled therapeutic intervention in order to improve the following deficits and impairments:   Cognitive communication deficit    Problem List Patient Active Problem List   Diagnosis Date Noted  . Acute CVA (cerebrovascular accident) (HCC) 08/19/2020  . H/O submandibular gland removal 01/29/2020  . Benign essential hypertension 01/19/2018  . Hyperlipidemia 01/19/2018   Srihan Brutus B. Murvin Natal, [facility], CCC-SLP Speech Language Pathologist  [name] 09/17/2020, 1:19 PM  [place] [facility] MAIN [facility] SERVICES [address] [place], [place], [address] Phone: [phone]   Fax:  [phone]   Name: Darin Lang MRN: [id number] Date of Birth: [birth year]

## 2020-09-22 ENCOUNTER — Ambulatory Visit: Payer: Medicare HMO | Admitting: Speech Pathology

## 2020-09-22 ENCOUNTER — Encounter: Payer: Self-pay | Admitting: Speech Pathology

## 2020-09-22 ENCOUNTER — Other Ambulatory Visit: Payer: Self-pay

## 2020-09-22 DIAGNOSIS — R41841 Cognitive communication deficit: Secondary | ICD-10-CM

## 2020-09-22 DIAGNOSIS — I639 Cerebral infarction, unspecified: Secondary | ICD-10-CM | POA: Diagnosis not present

## 2020-09-22 NOTE — Therapy (Signed)
[place] [facility] MAIN [facility] SERVICES [address] [place], [place], [address] Phone: [phone]   Fax:  [phone]  Speech Language Pathology Treatment  Patient Details  Name: Darin Lang MRN: [id number] Date of Birth: 06-15-1951 Referring Provider (SLP): Dr. Larwance Sachs   Encounter Date: 09/22/2020   End of Session - 09/22/20 1205    Visit Number 4    Number of Visits 17    Date for SLP Re-Evaluation 11/06/20    Authorization Type Medicare    Authorization Time Period 09/09/2020    Authorization - Visit Number 3    Authorization - Number of Visits 10    Progress Note Due on Visit 10    SLP Start Time 1000    SLP Stop Time  1050    SLP Time Calculation (min) 50 min    Activity Tolerance Patient tolerated treatment well           Past Medical History:  Diagnosis Date  . Hyperlipidemia   . Hypertension     Past Surgical History:  Procedure Laterality Date  . MOUTH SURGERY     salivary gland  . SUBMANDIBULAR GLAND EXCISION Right 01/29/2020   Procedure: EXCISION SUBMANDIBULAR GLAND;  Surgeon: Bud Face, MD;  Location: [facility] ORS;  Service: ENT;  Laterality: Right;    There were no vitals filed for this visit.   Subjective Assessment - 09/22/20 1205    Subjective PT and wife participated in session, eager to learn    Patient is accompained by: Family member    Currently in Pain? No/denies                 ADULT SLP TREATMENT - 09/22/20 0001      General Information   Behavior/Cognition Alert;Cooperative;Pleasant mood    HPI 69yo male      Treatment Provided   Treatment provided Cognitive-Linquistic      Cognitive-Linquistic Treatment   Treatment focused on Cognition;Patient/family/caregiver education    Skilled Treatment MEMORY:Pt seen for targeted ST intervention to target functional recall related to memory and word finding and increased independence. SLP reviewed compensatory memory strategies, pt and wife stated  that writing information and keeping calendars has beneficial. Wife states that speech has been clearer today than it has been, SLP notes lack of possible neurogenic stuttering when compared to evaluation session. Pt completed barrier tasks with no lapse in cogency and created associations between people and objects, while also recalling the connections when prompted.       Assessment / Recommendations / Plan   Plan Continue with current plan of care      Progression Toward Goals   Progression toward goals Progressing toward goals            SLP Education - 09/22/20 1205    Education Details Providd with compensatory strategies for recall, encouraged to utilize [employer]) Educated Patient;Spouse    Methods Explanation;Demonstration    Comprehension Verbalized understanding              SLP Long Term Goals - 09/10/20 1536      SLP LONG TERM GOAL #1   Title Pt will improve speech intelligibility and fluency in response to moderately complex cognitive linguistic tasks using constant flow phonation, controlled rate of speech, over-articulation, and increased loudness to achieve 80% or better intelligibility/fluency.    Time 8    Period Weeks    Status New    Target Date 11/06/20  SLP LONG TERM GOAL #2   Title Patient will demonstrate functional cognitive-communication skills for independent completion of personal responsibilities and leisure activities.    Time 8    Period Weeks    Status New    Target Date 11/06/20      SLP LONG TERM GOAL #3   Title Patient will complete complex memory activities with 80% accuracy.    Time 8    Period Weeks    Status New    Target Date 11/06/20            Plan - 09/22/20 1206    Clinical Impression Statement PT requires minimal prompting from SLP to organize information from images. Continue to provide strategies foe pt and wife to utilize, focusing on functional recall. Incorporate word finding activities to increase  accuracy and decrease frustration.    Speech Therapy Frequency 2x / week    Duration 8 weeks    Treatment/Interventions Language facilitation;Cognitive reorganization;SLP instruction and feedback;Patient/family education    Potential to Achieve Goals Good    Potential Considerations Ability to learn/carryover information;Severity of impairments;Co-morbidities;Pain level;Cooperation/participation level;Previous level of function;Family/community support    Consulted and Agree with Plan of Care Patient;Family member/caregiver    Family Member Consulted Spouse           Patient will benefit from skilled therapeutic intervention in order to improve the following deficits and impairments:   Cognitive communication deficit    Problem List Patient Active Problem List   Diagnosis Date Noted  . Acute CVA (cerebrovascular accident) (HCC) 08/19/2020  . H/O submandibular gland removal 01/29/2020  . Benign essential hypertension 01/19/2018  . Hyperlipidemia 01/19/2018   Zenaida Niece, BS Graduate Clinician Zenaida Niece 09/22/2020, 12:07 PM  [facility] MAIN [facility] SERVICES [address] [place], [place], [address] Phone: [phone]   Fax:  [phone]   Name: Darin Lang MRN: [id number] Date of Birth: 04-Apr-1951

## 2020-09-23 ENCOUNTER — Encounter: Payer: Medicare HMO | Admitting: Speech Pathology

## 2020-09-24 ENCOUNTER — Other Ambulatory Visit: Payer: Self-pay

## 2020-09-24 ENCOUNTER — Ambulatory Visit: Payer: Medicare HMO | Admitting: Speech Pathology

## 2020-09-24 DIAGNOSIS — R41841 Cognitive communication deficit: Secondary | ICD-10-CM

## 2020-09-24 DIAGNOSIS — I639 Cerebral infarction, unspecified: Secondary | ICD-10-CM | POA: Diagnosis not present

## 2020-09-25 ENCOUNTER — Encounter: Payer: Self-pay | Admitting: Speech Pathology

## 2020-09-25 NOTE — Therapy (Signed)
[place] [facility] MAIN [facility] SERVICES [address] [place], [place], [address] Phone: [phone]   Fax:  [phone]  Speech Language Pathology Treatment  Patient Details  Name: Darin Lang MRN: [id number] Date of Birth: Jun 25, 1951 Referring Provider (SLP): Dr. Larwance Sachs   Encounter Date: 09/24/2020   End of Session - 09/25/20 1514    Visit Number 5    Number of Visits 17    Date for SLP Re-Evaluation 11/06/20    Authorization Type Medicare    Authorization Time Period 09/09/2020    Authorization - Visit Number 4    Progress Note Due on Visit 10    SLP Start Time 1400    SLP Stop Time  1450    SLP Time Calculation (min) 50 min    Activity Tolerance Patient tolerated treatment well           Past Medical History:  Diagnosis Date  . Hyperlipidemia   . Hypertension     Past Surgical History:  Procedure Laterality Date  . MOUTH SURGERY     salivary gland  . SUBMANDIBULAR GLAND EXCISION Right 01/29/2020   Procedure: EXCISION SUBMANDIBULAR GLAND;  Surgeon: Bud Face, MD;  Location: [facility] ORS;  Service: ENT;  Laterality: Right;    There were no vitals filed for this visit.   Subjective Assessment - 09/25/20 1514    Subjective PT and wife participated in session, eager to learn                 ADULT SLP TREATMENT - 09/25/20 0001      General Information   Behavior/Cognition Alert;Cooperative;Pleasant mood    HPI 69yo male      Treatment Provided   Treatment provided Cognitive-Linquistic      Pain Assessment   Pain Assessment No/denies pain      Cognitive-Linquistic Treatment   Treatment focused on Cognition;Patient/family/caregiver education    Skilled Treatment COGNITIVE COMMUNICATION BARRIERS: Patient reports that his speech is essentially back to normal.  He and his wife continue to report memory deficits and word finding difficulties. MEMORY: Answer detail questions RE: complex picture with 70% accuracy.  Repeat  3-word list in reverse order with 80% accuracy. Recall/draw boxed information (3 shapes) with 25% accuracy.  Accurately recall associated object pairs and recall 8/10 pictured objects without cues.  Answer inclusion questions RE: 4-word list presented orally with 90% accuracy.  Recall 4 pictured items at 5 minutes (4/4), 15 minutes (4/4), and 25 minutes (max to cue first item, then the remaining 3).      Assessment / Recommendations / Plan   Plan Continue with current plan of care      Progression Toward Goals   Progression toward goals Progressing toward goals            SLP Education - 09/25/20 1514    Education Details Memory strategies    Person(s) Educated Patient    Methods Explanation    Comprehension Verbalized understanding              SLP Long Term Goals - 09/10/20 1536      SLP LONG TERM GOAL #1   Title Pt will improve speech intelligibility and fluency in response to moderately complex cognitive linguistic tasks using constant flow phonation, controlled rate of speech, over-articulation, and increased loudness to achieve 80% or better intelligibility/fluency.    Time 8    Period Weeks    Status New    Target Date 11/06/20  SLP LONG TERM GOAL #2   Title Patient will demonstrate functional cognitive-communication skills for independent completion of personal responsibilities and leisure activities.    Time 8    Period Weeks    Status New    Target Date 11/06/20      SLP LONG TERM GOAL #3   Title Patient will complete complex memory activities with 80% accuracy.    Time 8    Period Weeks    Status New    Target Date 11/06/20            Plan - 09/25/20 1515    Clinical Impression Statement The patient and his wife state that his speech is largely back to normal.  He continues to have difficulty with memory and word finding.  Will continue ST to focus on memory and word finding.    Speech Therapy Frequency 2x / week    Duration 8 weeks     Treatment/Interventions Language facilitation;Cognitive reorganization;SLP instruction and feedback;Patient/family education    Potential to Achieve Goals Good    Potential Considerations Ability to learn/carryover information;Severity of impairments;Co-morbidities;Pain level;Cooperation/participation level;Previous level of function;Family/community support    Consulted and Agree with Plan of Care Patient;Family member/caregiver    Family Member Consulted Spouse           Patient will benefit from skilled therapeutic intervention in order to improve the following deficits and impairments:   Cognitive communication deficit    Problem List Patient Active Problem List   Diagnosis Date Noted  . Acute CVA (cerebrovascular accident) (HCC) 08/19/2020  . H/O submandibular gland removal 01/29/2020  . Benign essential hypertension 01/19/2018  . Hyperlipidemia 01/19/2018   Dollene Primrose, MS/CCC- SLP  Leandrew Koyanagi 09/25/2020, 3:16 PM  [place] [facility] MAIN [facility] SERVICES [address] [place], [place], [address] Phone: [phone]   Fax:  [phone]   Name: Darin Lang MRN: [id number] Date of Birth: 02/15/1951

## 2020-10-06 ENCOUNTER — Encounter: Payer: Medicare HMO | Admitting: Speech Pathology

## 2020-10-14 ENCOUNTER — Ambulatory Visit: Payer: Medicare HMO | Attending: Family Medicine | Admitting: Speech Pathology

## 2020-10-15 ENCOUNTER — Encounter: Payer: Medicare HMO | Admitting: Speech Pathology

## 2020-10-28 ENCOUNTER — Ambulatory Visit: Payer: Medicare HMO | Admitting: Speech Pathology

## 2020-10-30 ENCOUNTER — Ambulatory Visit: Payer: Medicare HMO | Admitting: Speech Pathology

## 2020-11-04 ENCOUNTER — Encounter: Payer: Medicare HMO | Admitting: Speech Pathology

## 2020-11-10 ENCOUNTER — Encounter: Payer: Medicare HMO | Admitting: Speech Pathology

## 2020-11-12 ENCOUNTER — Encounter: Payer: Medicare HMO | Admitting: Speech Pathology

## 2020-11-17 ENCOUNTER — Encounter: Payer: Medicare HMO | Admitting: Speech Pathology

## 2020-11-19 ENCOUNTER — Encounter: Payer: Medicare HMO | Admitting: Speech Pathology

## 2020-11-24 ENCOUNTER — Encounter: Payer: Medicare HMO | Admitting: Speech Pathology

## 2020-11-26 ENCOUNTER — Encounter: Payer: Medicare HMO | Admitting: Speech Pathology

## 2020-12-02 ENCOUNTER — Ambulatory Visit: Payer: Medicare HMO

## 2021-01-08 ENCOUNTER — Other Ambulatory Visit: Payer: Self-pay | Admitting: Gastroenterology

## 2021-01-08 DIAGNOSIS — R634 Abnormal weight loss: Secondary | ICD-10-CM

## 2021-01-08 DIAGNOSIS — R1013 Epigastric pain: Secondary | ICD-10-CM

## 2021-01-23 ENCOUNTER — Ambulatory Visit: Payer: Medicare HMO

## 2021-02-04 ENCOUNTER — Other Ambulatory Visit: Payer: Self-pay

## 2021-02-04 ENCOUNTER — Ambulatory Visit
Admission: RE | Admit: 2021-02-04 | Discharge: 2021-02-04 | Disposition: A | Discharge status: Home / Self Care | Payer: Medicare HMO | Source: Ambulatory Visit | Attending: Gastroenterology | Admitting: Gastroenterology

## 2021-02-04 DIAGNOSIS — R1013 Epigastric pain: Secondary | ICD-10-CM | POA: Insufficient documentation

## 2021-02-04 DIAGNOSIS — R634 Abnormal weight loss: Secondary | ICD-10-CM | POA: Diagnosis present

## 2021-02-04 LAB — POCT I-STAT CREATININE: Creatinine, Ser: 1 mg/dL (ref 0.61–1.24)

## 2021-02-04 MED ORDER — IOHEXOL 300 MG/ML  SOLN
100.0000 mL | Freq: Once | INTRAMUSCULAR | Status: AC | PRN
  Administered 2021-02-04: 100 mL via INTRAVENOUS

## 2021-03-05 ENCOUNTER — Other Ambulatory Visit
Admission: RE | Admit: 2021-03-05 | Discharge: 2021-03-05 | Disposition: A | Discharge status: Home / Self Care | Payer: Medicare HMO | Source: Ambulatory Visit | Attending: Gastroenterology | Admitting: Gastroenterology

## 2021-03-05 ENCOUNTER — Other Ambulatory Visit: Payer: Self-pay

## 2021-03-05 DIAGNOSIS — Z01812 Encounter for preprocedural laboratory examination: Secondary | ICD-10-CM | POA: Insufficient documentation

## 2021-03-05 DIAGNOSIS — U071 COVID-19: Secondary | ICD-10-CM | POA: Diagnosis not present

## 2021-03-05 LAB — SARS CORONAVIRUS 2 (TAT 6-24 HRS): SARS Coronavirus 2: POSITIVE — AB

## 2021-03-06 ENCOUNTER — Telehealth: Payer: Self-pay | Admitting: Unknown Physician Specialty

## 2021-03-06 NOTE — Telephone Encounter (Signed)
Called to discuss with patient about COVID-19 symptoms and the use of one of the available treatments for those with mild to moderate Covid symptoms and at a high risk of hospitalization.  Pt appears to qualify for outpatient treatment due to co-morbid conditions and/or a member of an at-risk group in accordance with the FDA Emergency Use Authorization.    Pt is currently asymptomatic.  Number given in case he develops symptoms.    Gabriel Cirri

## 2021-05-08 ENCOUNTER — Encounter: Payer: Self-pay | Admitting: *Deleted

## 2021-05-12 ENCOUNTER — Ambulatory Visit: Payer: Medicare HMO | Admitting: Certified Registered"

## 2021-05-12 ENCOUNTER — Encounter: Payer: Self-pay | Admitting: *Deleted

## 2021-05-12 ENCOUNTER — Ambulatory Visit
Admission: RE | Admit: 2021-05-12 | Discharge: 2021-05-12 | Disposition: A | Discharge status: Home / Self Care | Payer: Medicare HMO | Attending: Gastroenterology | Admitting: Gastroenterology

## 2021-05-12 ENCOUNTER — Encounter
Admission: RE | Disposition: A | Discharge status: Home / Self Care | Payer: Self-pay | Source: Home / Self Care | Attending: Gastroenterology

## 2021-05-12 ENCOUNTER — Other Ambulatory Visit: Payer: Self-pay

## 2021-05-12 DIAGNOSIS — Z7982 Long term (current) use of aspirin: Secondary | ICD-10-CM | POA: Insufficient documentation

## 2021-05-12 DIAGNOSIS — Z8673 Personal history of transient ischemic attack (TIA), and cerebral infarction without residual deficits: Secondary | ICD-10-CM | POA: Diagnosis not present

## 2021-05-12 DIAGNOSIS — K640 First degree hemorrhoids: Secondary | ICD-10-CM | POA: Insufficient documentation

## 2021-05-12 DIAGNOSIS — Z86010 Personal history of colonic polyps: Secondary | ICD-10-CM | POA: Diagnosis present

## 2021-05-12 DIAGNOSIS — B9681 Helicobacter pylori [H. pylori] as the cause of diseases classified elsewhere: Secondary | ICD-10-CM | POA: Diagnosis not present

## 2021-05-12 DIAGNOSIS — Z1211 Encounter for screening for malignant neoplasm of colon: Secondary | ICD-10-CM | POA: Diagnosis not present

## 2021-05-12 DIAGNOSIS — K2950 Unspecified chronic gastritis without bleeding: Secondary | ICD-10-CM | POA: Diagnosis not present

## 2021-05-12 DIAGNOSIS — Z79899 Other long term (current) drug therapy: Secondary | ICD-10-CM | POA: Insufficient documentation

## 2021-05-12 HISTORY — DX: Cerebral infarction, unspecified: I63.9

## 2021-05-12 HISTORY — PX: ESOPHAGOGASTRODUODENOSCOPY (EGD) WITH PROPOFOL: SHX5813

## 2021-05-12 HISTORY — PX: COLONOSCOPY WITH PROPOFOL: SHX5780

## 2021-05-12 SURGERY — ESOPHAGOGASTRODUODENOSCOPY (EGD) WITH PROPOFOL
Anesthesia: General

## 2021-05-12 MED ORDER — PHENYLEPHRINE HCL (PRESSORS) 10 MG/ML IV SOLN
INTRAVENOUS | Status: DC | PRN
  Administered 2021-05-12: 100 ug via INTRAVENOUS

## 2021-05-12 MED ORDER — PROPOFOL 500 MG/50ML IV EMUL
INTRAVENOUS | Status: AC
  Filled 2021-05-12: qty 50

## 2021-05-12 MED ORDER — PROPOFOL 500 MG/50ML IV EMUL
INTRAVENOUS | Status: DC | PRN
  Administered 2021-05-12: 175 ug/kg/min via INTRAVENOUS

## 2021-05-12 MED ORDER — EPHEDRINE 5 MG/ML INJ
INTRAVENOUS | Status: AC
  Filled 2021-05-12: qty 10

## 2021-05-12 MED ORDER — PROPOFOL 10 MG/ML IV BOLUS
INTRAVENOUS | Status: DC | PRN
  Administered 2021-05-12: 60 mg via INTRAVENOUS

## 2021-05-12 MED ORDER — SODIUM CHLORIDE 0.9 % IV SOLN: INTRAVENOUS | Status: DC

## 2021-05-12 MED ORDER — EPHEDRINE SULFATE 50 MG/ML IJ SOLN
INTRAMUSCULAR | Status: DC | PRN
  Administered 2021-05-12: 7.5 mg via INTRAVENOUS

## 2021-05-12 NOTE — Op Note (Signed)
[facility] Gastroenterology Patient Name: Darin Lang Procedure Date: 05/12/2021 8:16 AM MRN: [id number] Account #: [id number] Date of Birth: 1951/05/31 Admit Type: Outpatient Age: 70 Room: [facility] ENDO ROOM 1 Gender: Male Note Status: Finalized Procedure:             Upper GI endoscopy Indications:           Epigastric abdominal pain Providers:             Andrey Farmer MD, MD Referring MD:          Caprice Renshaw MD (Referring MD) Medicines:             Monitored Anesthesia Care Complications:         No immediate complications. Estimated blood loss:                         Minimal. Procedure:             Pre-Anesthesia Assessment:                        - Prior to the procedure, a History and Physical was                         performed, and patient medications and allergies were                         reviewed. The patient is competent. The risks and                         benefits of the procedure and the sedation options and                         risks were discussed with the patient. All questions                         were answered and informed consent was obtained.                         Patient identification and proposed procedure were                         verified by the physician, the nurse, the anesthetist                         and the technician in the endoscopy suite. Mental                         Status Examination: alert and oriented. Airway                         Examination: normal oropharyngeal airway and neck                         mobility. Respiratory Examination: clear to                         auscultation. CV Examination: normal. Prophylactic  Antibiotics: The patient does not require prophylactic                         antibiotics. Prior Anticoagulants: The patient has                         taken Plavix (clopidogrel), last dose was 5 days prior                         to procedure. ASA Grade  Assessment: II - A patient                         with mild systemic disease. After reviewing the risks                         and benefits, the patient was deemed in satisfactory                         condition to undergo the procedure. The anesthesia                         plan was to use monitored anesthesia care (MAC).                         Immediately prior to administration of medications,                         the patient was re-assessed for adequacy to receive                         sedatives. The heart rate, respiratory rate, oxygen                         saturations, blood pressure, adequacy of pulmonary                         ventilation, and response to care were monitored                         throughout the procedure. The physical status of the                         patient was re-assessed after the procedure.                        After obtaining informed consent, the endoscope was                         passed under direct vision. Throughout the procedure,                         the patient's blood pressure, pulse, and oxygen                         saturations were monitored continuously. The Endoscope                         was introduced through the mouth, and advanced  to the                         second part of duodenum. The upper GI endoscopy was                         accomplished without difficulty. The patient tolerated                         the procedure well. Findings:      The examined esophagus was normal.      Minimal inflammation characterized by erythema was found in the gastric       antrum. Biopsies were taken with a cold forceps for Helicobacter pylori       testing. Estimated blood loss was minimal.      The examined duodenum was normal. Impression:            - Normal esophagus.                        - Gastritis. Biopsied.                        - Normal examined duodenum. Recommendation:        - Perform a colonoscopy  today. Procedure Code(s):     --- Professional ---                        [phone], Esophagogastroduodenoscopy, flexible,                         transoral; with biopsy, single or multiple Diagnosis Code(s):     --- Professional ---                        K29.70, Gastritis, unspecified, without bleeding                        R10.13, Epigastric pain CPT copyright 2019 American Medical Association. All rights reserved. The codes documented in this report are preliminary and upon coder review may  be revised to meet current compliance requirements. Andrey Farmer MD, MD 05/12/2021 8:49:45 AM Number of Addenda: 0 Note Initiated On: 05/12/2021 8:16 AM Estimated Blood Loss:  Estimated blood loss was minimal.      [facility]

## 2021-05-12 NOTE — H&P (Signed)
Outpatient short stay form Pre-procedure 05/12/2021 8:12 AM Darin Lot MD, MPH  Primary Physician: Dr. Larwance Lang  Reason for visit:  EGD/Colonoscopy  History of present illness:   70 y/o gentleman with history of CVA and adenomatous polyps here for EGD for history of epigastric pain and colonoscopy for history of polyps. Takes plavix for CVA but has held for 5 days. No family history of GI malignancies. No abdominal surgeries.    Current Facility-Administered Medications:  .  0.9 %  sodium chloride infusion, , Intravenous, Continuous, Darin Lang, Darin Muskrat, MD, Last Rate: 20 mL/hr at 05/12/21 0733, New Bag at 05/12/21 1779  Medications Prior to Admission  Medication Sig Dispense Refill Last Dose  . aspirin 325 MG tablet Take 1 tablet (325 mg total) by mouth daily. 30 tablet 0 Past Week at Unknown time  . atorvastatin (LIPITOR) 80 MG tablet Take 1 tablet (80 mg total) by mouth daily. 30 tablet 0 05/11/2021 at Unknown time  . losartan (COZAAR) 25 MG tablet Take 25 mg by mouth daily.   05/11/2021 at Unknown time  . meclizine (ANTIVERT) 25 MG tablet Take 25 mg by mouth 3 (three) times daily as needed for dizziness.     . Multiple Vitamins-Minerals (CENTRUM) tablet Take 1 tablet by mouth daily.    05/11/2021 at Unknown time  . pantoprazole (PROTONIX) 40 MG tablet Take 40 mg by mouth daily.   05/11/2021 at Unknown time     No Known Allergies   Past Medical History:  Diagnosis Date  . Hyperlipidemia   . Hypertension   . Stroke [facility])     Review of systems:  Otherwise negative.    Physical Exam  Gen: Alert, oriented. Appears stated age.  HEENT: PERRLA. Lungs: No respiratory distress CV: RRR Abd: soft, benign, no masses Ext: No edema    Planned procedures: Proceed with EGD/colonoscopy. The patient understands the nature of the planned procedure, indications, risks, alternatives and potential complications including but not limited to bleeding, infection, perforation, damage to  internal organs and possible oversedation/side effects from anesthesia. The patient agrees and gives consent to proceed.  Please refer to procedure notes for findings, recommendations and patient disposition/instructions.     Darin Lot MD, MPH Gastroenterology 05/12/2021  8:12 AM

## 2021-05-12 NOTE — Transfer of Care (Signed)
Immediate Anesthesia Transfer of Care Note  Patient: Darin Lang  Procedure(s) Performed: ESOPHAGOGASTRODUODENOSCOPY (EGD) WITH PROPOFOL (N/A ) COLONOSCOPY WITH PROPOFOL (N/A )  Patient Location: Endoscopy Unit  Anesthesia Type:General  Level of Consciousness: awake, alert  and oriented  Airway & Oxygen Therapy: Patient Spontanous Breathing and Patient connected to nasal cannula oxygen  Post-op Assessment: Report given to RN and Post -op Vital signs reviewed and stable  Post vital signs: Reviewed and stable  Last Vitals:  Vitals Value Taken Time  BP    Temp    Pulse    Resp    SpO2      Last Pain:  Vitals:   05/12/21 0721  TempSrc: Temporal  PainSc: 0-No pain         Complications: No complications documented.

## 2021-05-12 NOTE — Op Note (Addendum)
[facility] Gastroenterology Patient Name: Darin Lang Procedure Date: 05/12/2021 8:15 AM MRN: [id number] Account #: [id number] Date of Birth: 09/13/1951 Admit Type: Outpatient Age: 70 Room: [facility] ENDO ROOM 1 Gender: Male Note Status: Finalized Procedure:             Colonoscopy Indications:           High risk colon cancer surveillance: Personal history                         of non-advanced adenoma Providers:             Andrey Farmer MD, MD Referring MD:          Caprice Renshaw MD (Referring MD) Medicines:             Monitored Anesthesia Care Complications:         No immediate complications. Procedure:             Pre-Anesthesia Assessment:                        - Prior to the procedure, a History and Physical was                         performed, and patient medications and allergies were                         reviewed. The patient is competent. The risks and                         benefits of the procedure and the sedation options and                         risks were discussed with the patient. All questions                         were answered and informed consent was obtained.                         Patient identification and proposed procedure were                         verified by the physician, the nurse, the anesthetist                         and the technician in the endoscopy suite. Mental                         Status Examination: alert and oriented. Airway                         Examination: normal oropharyngeal airway and neck                         mobility. Respiratory Examination: clear to                         auscultation. CV Examination: normal. Prophylactic  Antibiotics: The patient does not require prophylactic                         antibiotics. Prior Anticoagulants: The patient has                         taken Plavix (clopidogrel), last dose was 5 days prior                         to  procedure. ASA Grade Assessment: II - A patient                         with mild systemic disease. After reviewing the risks                         and benefits, the patient was deemed in satisfactory                         condition to undergo the procedure. The anesthesia                         plan was to use monitored anesthesia care (MAC).                         Immediately prior to administration of medications,                         the patient was re-assessed for adequacy to receive                         sedatives. The heart rate, respiratory rate, oxygen                         saturations, blood pressure, adequacy of pulmonary                         ventilation, and response to care were monitored                         throughout the procedure. The physical status of the                         patient was re-assessed after the procedure.                        After obtaining informed consent, the colonoscope was                         passed under direct vision. Throughout the procedure,                         the patient's blood pressure, pulse, and oxygen                         saturations were monitored continuously. The                         Colonoscope was introduced through the anus and  advanced to the the cecum, identified by appendiceal                         orifice and ileocecal valve. The colonoscopy was                         performed without difficulty. The patient tolerated                         the procedure well. The quality of the bowel                         preparation was good. Findings:      The perianal and digital rectal examinations were normal.      Internal hemorrhoids were found during retroflexion. The hemorrhoids       were Grade I (internal hemorrhoids that do not prolapse).      The exam was otherwise without abnormality on direct and retroflexion       views. Impression:            - Internal  hemorrhoids.                        - The examination was otherwise normal on direct and                         retroflexion views.                        - No specimens collected. Recommendation:        - Discharge patient to home.                        - Resume previous diet.                        - Continue present medications.                        - Repeat colonoscopy in 10 years for surveillance.                        - Return to referring physician as previously                         scheduled.                        - Resume Plavix (clopidogrel) at prior dose today. Procedure Code(s):     --- Professional ---                        J5701, Colorectal cancer screening; colonoscopy on                         individual at high risk Diagnosis Code(s):     --- Professional ---                        Z86.010, Personal history of colonic polyps  K64.0, First degree hemorrhoids CPT copyright 2019 American [employer]. All rights reserved. The codes documented in this report are preliminary and upon coder review may  be revised to meet current compliance requirements. Andrey Farmer MD, MD 05/12/2021 8:52:04 AM Number of Addenda: 0 Note Initiated On: 05/12/2021 8:15 AM Scope Withdrawal Time: 0 hours 7 minutes 18 seconds  Total Procedure Duration: 0 hours 12 minutes 46 seconds  Estimated Blood Loss:  Estimated blood loss: none.      [facility]

## 2021-05-12 NOTE — Interval H&P Note (Signed)
History and Physical Interval Note:  05/12/2021 8:14 AM  Darin Lang  has presented today for surgery, with the diagnosis of hx of aden, polyps of colon.  The various methods of treatment have been discussed with the patient and family. After consideration of risks, benefits and other options for treatment, the patient has consented to  Procedure(s): ESOPHAGOGASTRODUODENOSCOPY (EGD) WITH PROPOFOL (N/A) COLONOSCOPY WITH PROPOFOL (N/A) as a surgical intervention.  The patient's history has been reviewed, patient examined, no change in status, stable for surgery.  I have reviewed the patient's chart and labs.  Questions were answered to the patient's satisfaction.     Regis Bill  Ok to proceed with EGD/Colonoscopy

## 2021-05-12 NOTE — Anesthesia Preprocedure Evaluation (Signed)
Anesthesia Evaluation  Patient identified by MRN, date of birth, ID band Patient awake    Reviewed: Allergy & Precautions, NPO status , Patient's Chart, lab work & pertinent test results  History of Anesthesia Complications Negative for: history of anesthetic complications  Airway Mallampati: III  TM Distance: >3 FB Neck ROM: full    Dental  (+) Chipped, Missing   Pulmonary neg pulmonary ROS, neg shortness of breath,    Pulmonary exam normal        Cardiovascular Exercise Tolerance: Good hypertension, (-) angina(-) DOE Normal cardiovascular exam     Neuro/Psych CVA negative psych ROS   GI/Hepatic negative GI ROS, Neg liver ROS, neg GERD  ,  Endo/Other  negative endocrine ROS  Renal/GU negative Renal ROS  negative genitourinary   Musculoskeletal   Abdominal   Peds  Hematology negative hematology ROS (+)   Anesthesia Other Findings Past Medical History: No date: Hyperlipidemia No date: Hypertension No date: Stroke [facility])  Past Surgical History: No date: COLONOSCOPY No date: MOUTH SURGERY     Comment:  salivary gland 01/29/2020: SUBMANDIBULAR GLAND EXCISION; Right     Comment:  Procedure: EXCISION SUBMANDIBULAR GLAND;  Surgeon:               Bud Face, MD;  Location: [facility] ORS;  Service:               ENT;  Laterality: Right;  BMI    Body Mass Index: 25.37 kg/m      Reproductive/Obstetrics negative OB ROS                             Anesthesia Physical Anesthesia Plan  ASA: III  Anesthesia Plan: General   Post-op Pain Management:    Induction: Intravenous  PONV Risk Score and Plan: Propofol infusion and TIVA  Airway Management Planned: Natural Airway and Nasal Cannula  Additional Equipment:   Intra-op Plan:   Post-operative Plan:   Informed Consent: I have reviewed the patients History and Physical, chart, labs and discussed the procedure including the  risks, benefits and alternatives for the proposed anesthesia with the patient or authorized representative who has indicated his/her understanding and acceptance.     Dental Advisory Given  Plan Discussed with: Anesthesiologist, CRNA and Surgeon  Anesthesia Plan Comments: (Patient consented for risks of anesthesia including but not limited to:  - adverse reactions to medications - risk of airway placement if required - damage to eyes, teeth, lips or other oral mucosa - nerve damage due to positioning  - sore throat or hoarseness - Damage to heart, brain, nerves, lungs, other parts of body or loss of life  Patient voiced understanding.)        Anesthesia Quick Evaluation

## 2021-05-12 NOTE — Anesthesia Postprocedure Evaluation (Signed)
Anesthesia Post Note  Patient: Darin Lang  Procedure(s) Performed: ESOPHAGOGASTRODUODENOSCOPY (EGD) WITH PROPOFOL (N/A ) COLONOSCOPY WITH PROPOFOL (N/A )  Patient location during evaluation: Endoscopy Anesthesia Type: General Level of consciousness: awake and alert Pain management: pain level controlled Vital Signs Assessment: post-procedure vital signs reviewed and stable Respiratory status: spontaneous breathing, nonlabored ventilation, respiratory function stable and patient connected to nasal cannula oxygen Cardiovascular status: blood pressure returned to baseline and stable Postop Assessment: no apparent nausea or vomiting Anesthetic complications: no   No complications documented.   Last Vitals:  Vitals:   05/12/21 0859 05/12/21 0909  BP: (!) 113/52 (!) 106/56  Pulse: 91 81  Resp: 13 16  Temp:    SpO2: 100% 100%    Last Pain:  Vitals:   05/12/21 0909  TempSrc:   PainSc: 0-No pain                 Cleda Mccreedy Elga Santy

## 2021-05-13 ENCOUNTER — Encounter: Payer: Self-pay | Admitting: Gastroenterology

## 2021-05-14 LAB — SURGICAL PATHOLOGY

## 2021-08-04 IMAGING — MR MR MRA HEAD W/O CM
1 series · 20 of 48 positions shown · non-contrast
Comparison: Brain MRI 08/19/2020. Carotid artery duplex 08/19/2020.

CLINICAL DATA: Stroke, follow-up.

EXAM:
MRA HEAD WITHOUT CONTRAST
TECHNIQUE: Angiographic images of the Circle of Willis were obtained using MRA
technique without intravenous contrast.

[Series 9: TOF · axial · 0.5mm · 0.41mm/px · z∈[-118,-15]mm · 20 of 224 slices shown]
[im 1/224]
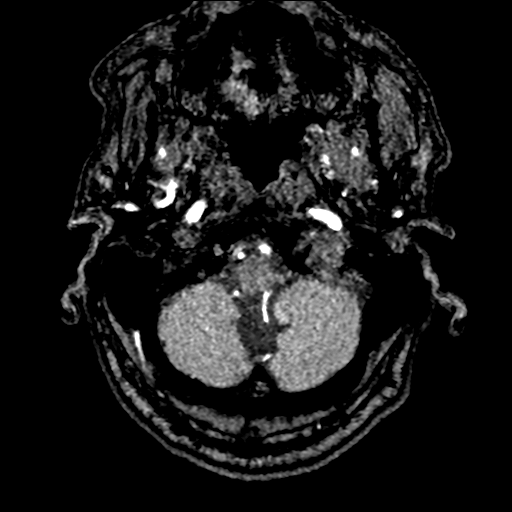
[im 5/224]
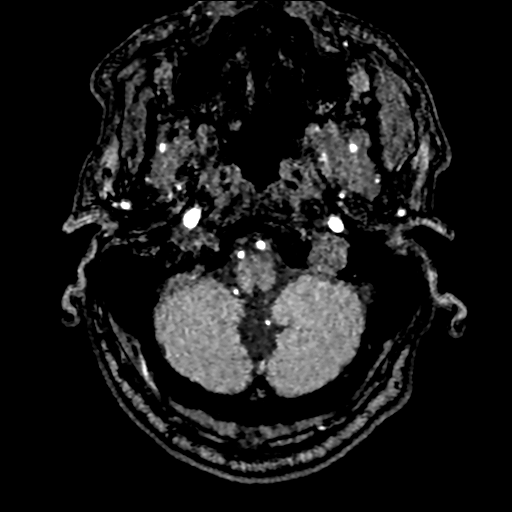
[im 10/224]
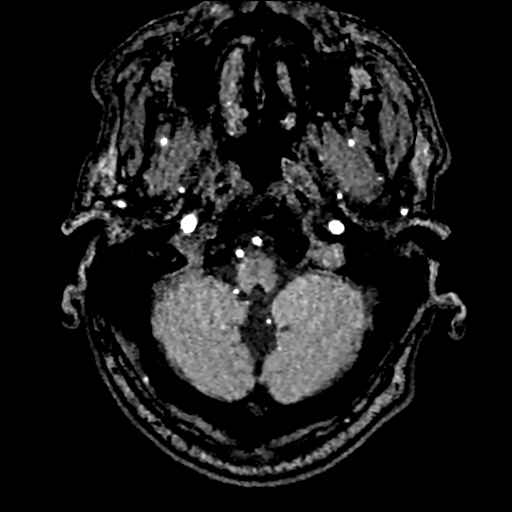
[im 15/224]
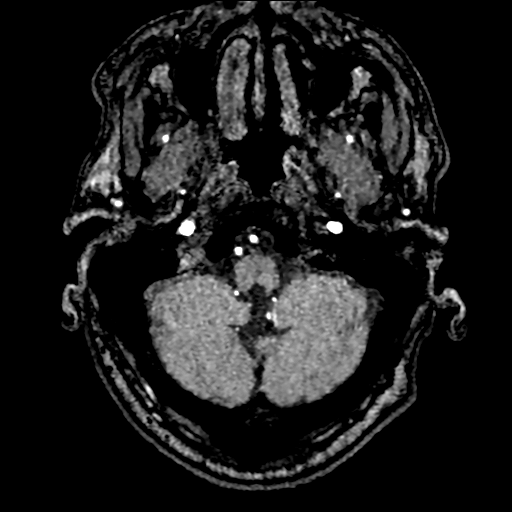
[im 19/224]
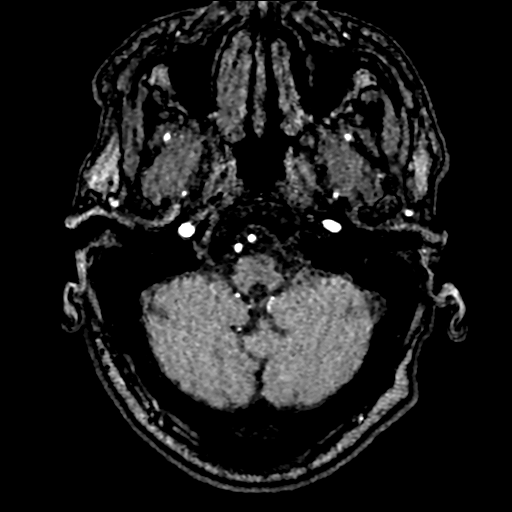
[im 24/224]
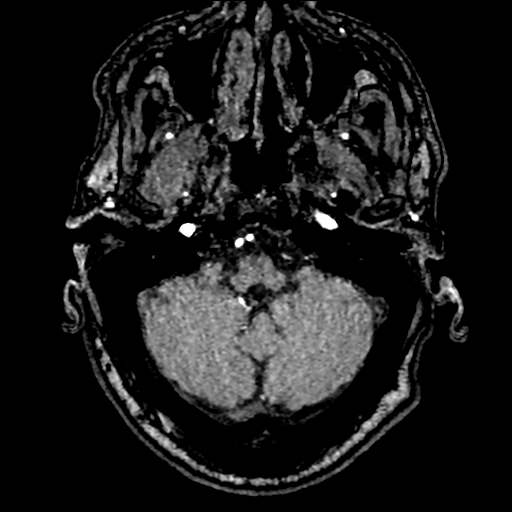
[im 29/224]
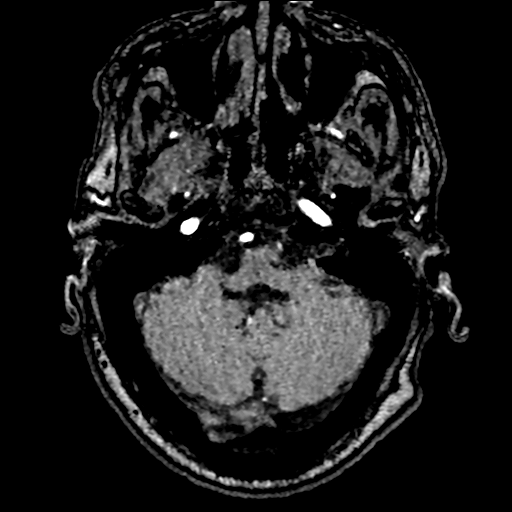
[im 34/224]
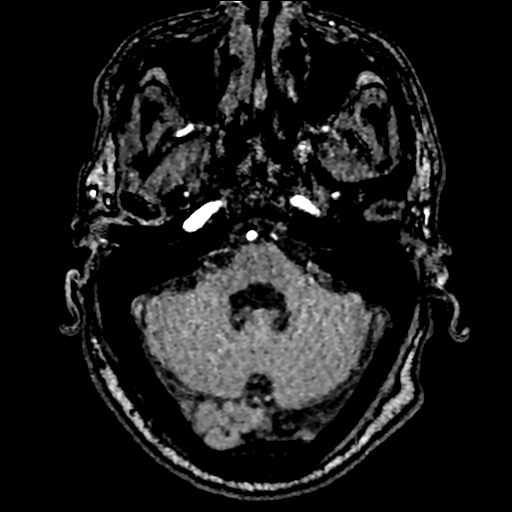
[im 38/224]
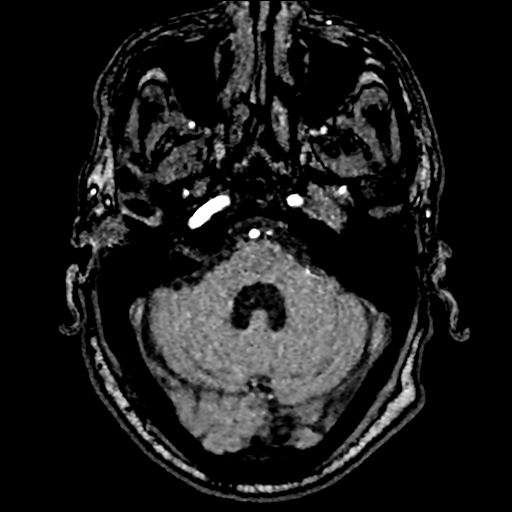
[im 43/224]
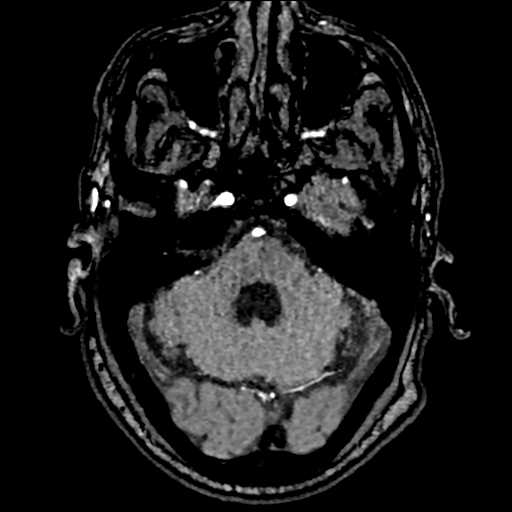
[im 48/224]
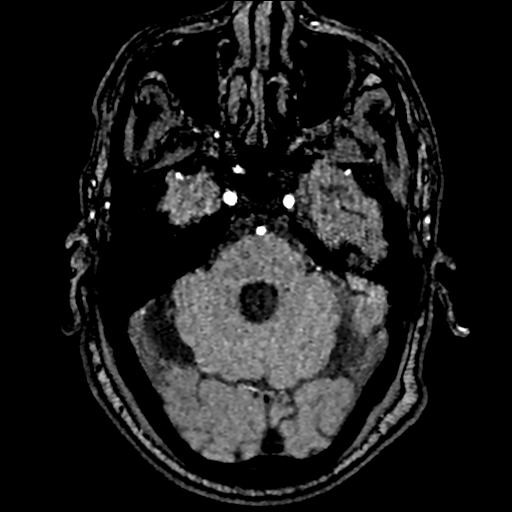
[im 53/224]
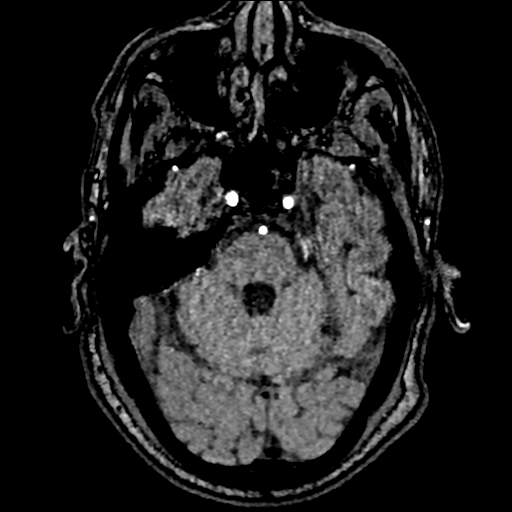
[im 72/224]
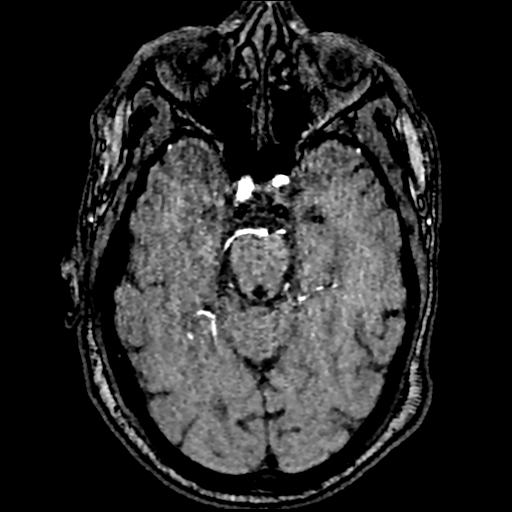
[im 100/224]
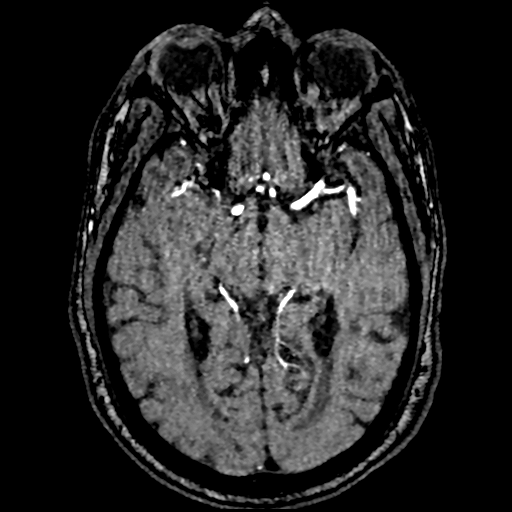
[im 114/224]
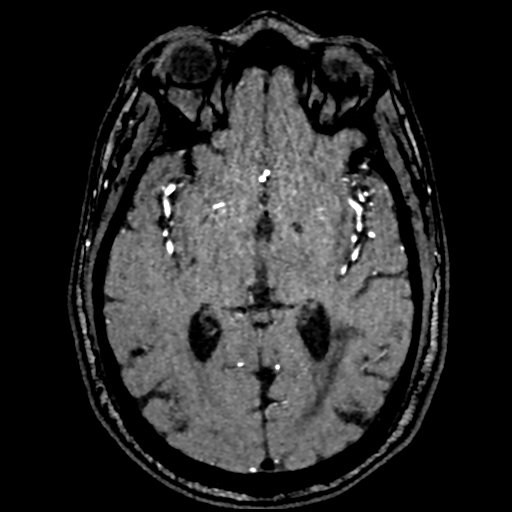
[im 129/224]
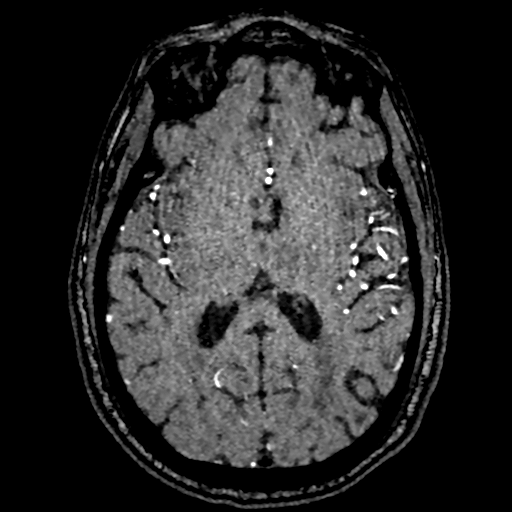
[im 157/224]
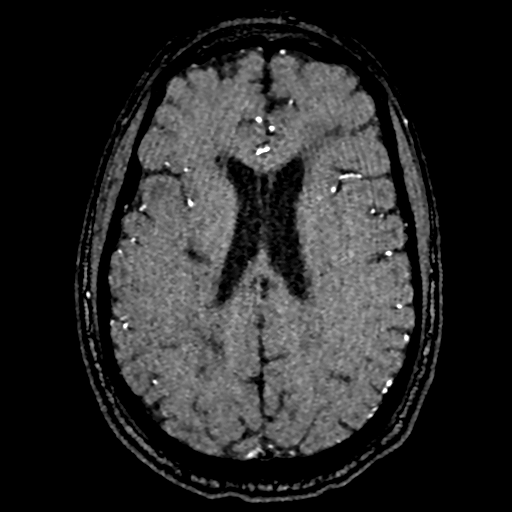
[im 186/224]
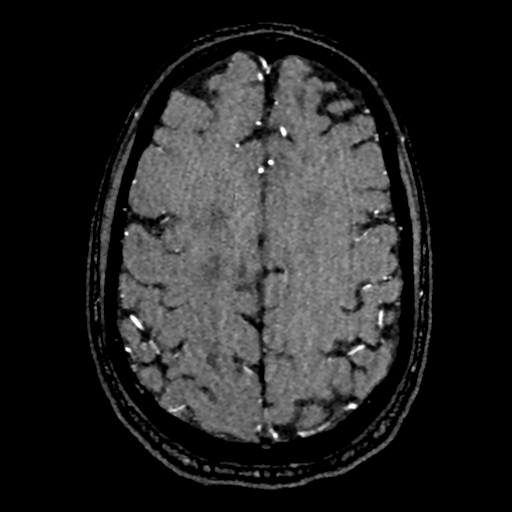
[im 190/224]
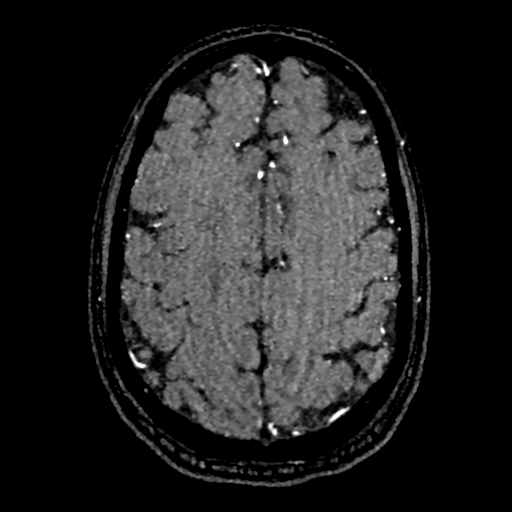
[im 214/224]
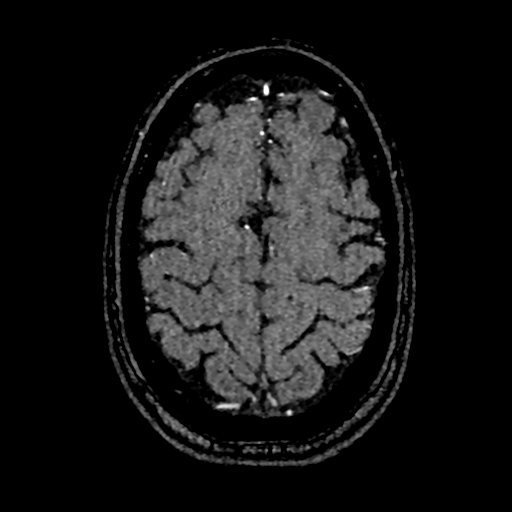

[20 of 48 positions shown; findings below may reference images not displayed]

FINDINGS: The intracranial internal carotid arteries are patent. The M1 middle
cerebral arteries are patent without significant stenosis. No M2
proximal branch occlusion or high-grade proximal stenosis is
identified. The anterior cerebral arteries are patent.

The visualized intracranial vertebral arteries are patent. The
basilar artery is patent. The posterior cerebral arteries are patent
proximally without significant stenosis. Sizable posterior
communicating arteries are present bilaterally.

No intracranial aneurysm is identified.
IMPRESSION: No intracranial large vessel occlusion or proximal high-grade
arterial stenosis.

## 2023-05-12 ENCOUNTER — Other Ambulatory Visit (HOSPITAL_COMMUNITY): Payer: Self-pay | Admitting: Student

## 2023-05-12 DIAGNOSIS — R4189 Other symptoms and signs involving cognitive functions and awareness: Secondary | ICD-10-CM

## 2023-05-12 DIAGNOSIS — I6381 Other cerebral infarction due to occlusion or stenosis of small artery: Secondary | ICD-10-CM

## 2023-06-25 ENCOUNTER — Ambulatory Visit (HOSPITAL_COMMUNITY)
Admission: RE | Admit: 2023-06-25 | Discharge: 2023-06-25 | Disposition: A | Discharge status: Home / Self Care | Payer: Medicare HMO | Source: Ambulatory Visit | Attending: Student | Admitting: Student

## 2023-06-25 DIAGNOSIS — R4189 Other symptoms and signs involving cognitive functions and awareness: Secondary | ICD-10-CM | POA: Insufficient documentation

## 2023-06-25 DIAGNOSIS — I6381 Other cerebral infarction due to occlusion or stenosis of small artery: Secondary | ICD-10-CM | POA: Insufficient documentation

## 2024-07-14 ENCOUNTER — Other Ambulatory Visit: Payer: Self-pay

## 2024-07-14 ENCOUNTER — Emergency Department
Admission: EM | Admit: 2024-07-14 | Discharge: 2024-07-14 | Disposition: A | Discharge status: Home / Self Care | Attending: Emergency Medicine | Admitting: Emergency Medicine

## 2024-07-14 ENCOUNTER — Emergency Department

## 2024-07-14 DIAGNOSIS — I10 Essential (primary) hypertension: Secondary | ICD-10-CM | POA: Insufficient documentation

## 2024-07-14 DIAGNOSIS — R1013 Epigastric pain: Secondary | ICD-10-CM | POA: Insufficient documentation

## 2024-07-14 LAB — CBC
HCT: 36.4 % — ABNORMAL LOW (ref 39.0–52.0)
Hemoglobin: 12.3 g/dL — ABNORMAL LOW (ref 13.0–17.0)
MCH: 30.4 pg (ref 26.0–34.0)
MCHC: 33.8 g/dL (ref 30.0–36.0)
MCV: 90.1 fL (ref 80.0–100.0)
Platelets: 209 K/uL (ref 150–400)
RBC: 4.04 MIL/uL — ABNORMAL LOW (ref 4.22–5.81)
RDW: 13.2 % (ref 11.5–15.5)
WBC: 7.7 K/uL (ref 4.0–10.5)
nRBC: 0 % (ref 0.0–0.2)

## 2024-07-14 LAB — COMPREHENSIVE METABOLIC PANEL WITH GFR
ALT: 18 U/L (ref 0–44)
AST: 24 U/L (ref 15–41)
Albumin: 4.1 g/dL (ref 3.5–5.0)
Alkaline Phosphatase: 57 U/L (ref 38–126)
Anion gap: 9 (ref 5–15)
BUN: 17 mg/dL (ref 8–23)
CO2: 26 mmol/L (ref 22–32)
Calcium: 9 mg/dL (ref 8.9–10.3)
Chloride: 103 mmol/L (ref 98–111)
Creatinine, Ser: 0.91 mg/dL (ref 0.61–1.24)
GFR, Estimated: >60 mL/min (ref >60)
Glucose, Bld: 117 mg/dL — ABNORMAL HIGH (ref 70–99)
Potassium: 3.9 mmol/L (ref 3.5–5.1)
Sodium: 138 mmol/L (ref 135–145)
Total Bilirubin: 0.9 mg/dL (ref 0.0–1.2)
Total Protein: 7.1 g/dL (ref 6.5–8.1)

## 2024-07-14 LAB — LIPASE, BLOOD: Lipase: 26 U/L (ref 11–51)

## 2024-07-14 MED ORDER — SUCRALFATE 1 G PO TABS: 1.0000 g | ORAL_TABLET | Freq: Three times a day (TID) | ORAL | 1 refills | Status: AC

## 2024-07-14 NOTE — ED Provider Notes (Signed)
 [facility] Provider Note   Event Date/Time   First MD Initiated Contact with Patient 07/14/24 1028     (approximate) History  Abdominal Pain  HPI Darin Lang is a 73 y.o. male with past medical history of of CVA with word finding difficulties, hypertension, and hyperlipidemia who presents complaining of intermittent abdominal pain over the last year.  Patient describes a midepigastric abdominal pain that does not radiate and usually occurs in the middle of the night occasionally waking him up.  Patient states that he has had this pain since last night that woke him from sleep.  Patient describes a burning, epigastric pain that does not radiate and has no exacerbating or relieving factors.  Patient denies constipation and states he has had well-formed bowel movements last night before going to bed.  Patient denies any recent travel, sick contacts, or food out of the ordinary.  Patient denies any late-night food, spicy food, greasy food.  Patient denies any missed doses of his PPI ROS: Patient currently denies any vision changes, tinnitus, difficulty speaking, facial droop, sore throat, chest pain, shortness of breath, nausea/vomiting/diarrhea, dysuria, or weakness/numbness/paresthesias in any extremity   Physical Exam  Triage Vital Signs: ED Triage Vitals  Encounter Vitals Group     BP 07/14/24 1013 138/74     Girls Systolic BP Percentile --      Girls Diastolic BP Percentile --      Boys Systolic BP Percentile --      Boys Diastolic BP Percentile --      Pulse Rate 07/14/24 1010 67     Resp 07/14/24 1010 20     Temp 07/14/24 1010 98.9 F (37.2 C)     Temp Source 07/14/24 1010 Oral     SpO2 07/14/24 1010 100 %     Weight 07/14/24 1013 150 lb (68 kg)     Height 07/14/24 1013 5' 5 (1.651 m)     Head Circumference --      Peak Flow --      Pain Score 07/14/24 1011 10     Pain Loc --      Pain Education --      Exclude from Growth Chart --    Most recent vital  signs: Vitals:   07/14/24 1100 07/14/24 1100  BP: 134/72   Pulse:  68  Resp:    Temp:  97.9 F (36.6 C)  SpO2:     General: Awake, oriented x4. CV:  Good peripheral perfusion. Resp:  Normal effort. Abd:  No distention.  Nontender to palpation Other:  Elderly well-developed, well-nourished, African-American male resting comfortably in no acute distress ED Results / Procedures / Treatments  Labs (all labs ordered are listed, but only abnormal results are displayed) Labs Reviewed  COMPREHENSIVE METABOLIC PANEL WITH GFR - Abnormal; Notable for the following components:      Result Value   Glucose, Bld 117 (*)    All other components within normal limits  CBC - Abnormal; Notable for the following components:   RBC 4.04 (*)    Hemoglobin 12.3 (*)    HCT 36.4 (*)    All other components within normal limits  LIPASE, BLOOD  URINALYSIS, ROUTINE W REFLEX MICROSCOPIC   RADIOLOGY ED MD interpretation: KUB independently interpreted and shows a nonobstructive bowel gas pattern with an approximately 6 x 8 mm calcification overlying the right renal shadow - All radiology independently interpreted and agree with radiology assessment Official radiology report(s): DG Abdomen 1  View Result Date: 07/14/2024 CLINICAL DATA:  generalized abdominal pain, constipation EXAM: ABDOMEN - 1 VIEW COMPARISON:  None Available. FINDINGS: The bowel gas pattern is non-obstructive. Small to moderate stool burden. No evidence of pneumoperitoneum, within the limitations of a supine film. No acute osseous abnormalities. There is an approximately 6 x 8 mm calcification overlying the right renal shadow region, which is of indeterminate etiology. The soft tissues are otherwise within normal limits. Surgical changes, devices, tubes and lines: None. IMPRESSION: Nonobstructive bowel gas pattern. There is an approximately 6 x 8 mm calcification overlying the right renal shadow region, which is of indeterminate etiology.  Electronically Signed   By: Ree Molt M.D.   On: 07/14/2024 11:16   PROCEDURES: Critical Care performed: No Procedures MEDICATIONS ORDERED IN ED: Medications - No data to display IMPRESSION / MDM / ASSESSMENT AND PLAN / ED COURSE  I reviewed the triage vital signs and the nursing notes.                             The patient is on the cardiac monitor to evaluate for evidence of arrhythmia and/or significant heart rate changes. Patient's presentation is most consistent with acute presentation with potential threat to life or bodily function. Patient presents for abdominal pain.  Differential diagnosis includes appendicitis, abdominal aortic aneurysm, surgical biliary disease, pancreatitis, SBO, mesenteric ischemia, serious intra-abdominal bacterial illness, genital torsion. Doubt atypical ACS. Based on history, physical exam, radiologic/laboratory evaluation, there is no red flag results or symptomatology requiring emergent intervention or need for admission at this time Pt tolerating PO. Disposition: Patient will be discharged with strict return precautions and follow up with primary MD within 12-24 hours for further evaluation. Patient understands that this still may have an early presentation of an emergent medical condition such as appendicitis that will require a recheck.   FINAL CLINICAL IMPRESSION(S) / ED DIAGNOSES   Final diagnoses:  Epigastric pain   Rx / DC Orders   ED Discharge Orders          Ordered    sucralfate  (CARAFATE ) 1 g tablet  3 times daily with meals & bedtime        07/14/24 1242           Note:  This document was prepared using Dragon voice recognition software and may include unintentional dictation errors.   Jossie Artist POUR, MD 07/14/24 [phone]

## 2024-07-14 NOTE — ED Triage Notes (Signed)
 Pt to ED with wife for generalized abdominal pain since 1 year. Currently 10/10. Hx stroke with residual word finding difficulty. Wife states no problems with BM or urination.

## 2024-07-14 NOTE — ED Notes (Signed)
 Wife at bedside, warm blanket provided.

## 2024-09-28 ENCOUNTER — Other Ambulatory Visit: Payer: Self-pay | Admitting: Gastroenterology

## 2024-09-28 DIAGNOSIS — R1013 Epigastric pain: Secondary | ICD-10-CM

## 2024-10-02 ENCOUNTER — Ambulatory Visit
Admission: RE | Admit: 2024-10-02 | Discharge: 2024-10-02 | Disposition: A | Discharge status: Home / Self Care | Source: Ambulatory Visit | Attending: Gastroenterology | Admitting: Gastroenterology

## 2024-10-02 DIAGNOSIS — R1013 Epigastric pain: Secondary | ICD-10-CM | POA: Diagnosis present

## 2024-10-10 ENCOUNTER — Other Ambulatory Visit: Payer: Self-pay | Admitting: Gastroenterology

## 2024-10-10 DIAGNOSIS — K7689 Other specified diseases of liver: Secondary | ICD-10-CM

## 2024-10-18 ENCOUNTER — Ambulatory Visit
Admission: RE | Admit: 2024-10-18 | Discharge: 2024-10-18 | Disposition: A | Discharge status: Home / Self Care | Source: Ambulatory Visit | Attending: Gastroenterology | Admitting: Gastroenterology

## 2024-10-18 DIAGNOSIS — K7689 Other specified diseases of liver: Secondary | ICD-10-CM | POA: Insufficient documentation

## 2024-10-18 MED ORDER — GADOBUTROL 1 MMOL/ML IV SOLN
6.0000 mL | Freq: Once | INTRAVENOUS | Status: AC | PRN
  Administered 2024-10-18: 6 mL via INTRAVENOUS

## 2024-11-14 ENCOUNTER — Emergency Department
Admission: EM | Admit: 2024-11-14 | Discharge: 2024-11-14 | Disposition: A | Discharge status: Home / Self Care | Attending: Emergency Medicine | Admitting: Emergency Medicine

## 2024-11-14 ENCOUNTER — Other Ambulatory Visit: Payer: Self-pay

## 2024-11-14 ENCOUNTER — Emergency Department

## 2024-11-14 DIAGNOSIS — R55 Syncope and collapse: Secondary | ICD-10-CM | POA: Diagnosis present

## 2024-11-14 LAB — URINALYSIS, ROUTINE W REFLEX MICROSCOPIC
Bilirubin Urine: NEGATIVE
Glucose, UA: NEGATIVE mg/dL
Hgb urine dipstick: NEGATIVE
Ketones, ur: NEGATIVE mg/dL
Leukocytes,Ua: NEGATIVE
Nitrite: NEGATIVE
Protein, ur: NEGATIVE mg/dL
Specific Gravity, Urine: 1.016 (ref 1.005–1.030)
pH: 5 (ref 5.0–8.0)

## 2024-11-14 LAB — COMPREHENSIVE METABOLIC PANEL WITH GFR
ALT: 20 U/L (ref 0–44)
AST: 28 U/L (ref 15–41)
Albumin: 4.1 g/dL (ref 3.5–5.0)
Alkaline Phosphatase: 67 U/L (ref 38–126)
Anion gap: 6 (ref 5–15)
BUN: 18 mg/dL (ref 8–23)
CO2: 29 mmol/L (ref 22–32)
Calcium: 9.2 mg/dL (ref 8.9–10.3)
Chloride: 104 mmol/L (ref 98–111)
Creatinine, Ser: 0.93 mg/dL (ref 0.61–1.24)
GFR, Estimated: >60 mL/min (ref >60)
Glucose, Bld: 102 mg/dL — ABNORMAL HIGH (ref 70–99)
Potassium: 4 mmol/L (ref 3.5–5.1)
Sodium: 139 mmol/L (ref 135–145)
Total Bilirubin: 0.5 mg/dL (ref 0.0–1.2)
Total Protein: 6.5 g/dL (ref 6.5–8.1)

## 2024-11-14 LAB — CBC
HCT: 33.3 % — ABNORMAL LOW (ref 39.0–52.0)
Hemoglobin: 11.1 g/dL — ABNORMAL LOW (ref 13.0–17.0)
MCH: 30.2 pg (ref 26.0–34.0)
MCHC: 33.3 g/dL (ref 30.0–36.0)
MCV: 90.7 fL (ref 80.0–100.0)
Platelets: 201 K/uL (ref 150–400)
RBC: 3.67 MIL/uL — ABNORMAL LOW (ref 4.22–5.81)
RDW: 13.5 % (ref 11.5–15.5)
WBC: 7.2 K/uL (ref 4.0–10.5)
nRBC: 0 % (ref 0.0–0.2)

## 2024-11-14 LAB — TROPONIN T, HIGH SENSITIVITY
Troponin T High Sensitivity: 16 ng/L (ref 0–19)
Troponin T High Sensitivity: 15 ng/L (ref 0–19)

## 2024-11-14 NOTE — ED Triage Notes (Signed)
 BIB EMS reports he was out raking leaves all day and was on a stool in the laundry room and had syncopal episode and was just not responding.  When ems arrived patient was lying on the floor.  Stroke screen was negative.  A[facility]Ox[facility] EMS reports on [facility] for TIA and past strokes.  Wife reports his look appeared like his last CVA. CBG 163.  EMS reports he was complaining of left arm pain but appeared a cramp.  18g LFA.

## 2024-11-14 NOTE — ED Provider Notes (Signed)
 [facility] Provider Note    Event Date/Time   First MD Initiated Contact with Patient 11/14/24 1846     (approximate)   History   Loss of Consciousness   HPI  Ravin Rodas is a 73 y.o. male who presents to the emergency department today because of concerns for a syncopal episode.  The patient does not recall the episode.  According to family he had been out raking in the yard.  The patient does this regularly this is not unusual.  He then came inside and family heard a loud noise.  When they found him he was propped up against the wall.  At the time my exam patient denies any chest pain or shortness of breath.  He denies history of syncope.  No headache.  Did initially have some left arm pain however that had resolved by the time my exam.     Physical Exam   Triage Vital Signs: ED Triage Vitals  Encounter Vitals Group     BP 11/14/24 1844 107/75     Girls Systolic BP Percentile --      Girls Diastolic BP Percentile --      Boys Systolic BP Percentile --      Boys Diastolic BP Percentile --      Pulse Rate 11/14/24 1844 69     Resp 11/14/24 1844 14     Temp 11/14/24 1844 97.9 F (36.6 C)     Temp Source 11/14/24 1844 Oral     SpO2 11/14/24 1844 100 %     Weight 11/14/24 1841 149 lb 14.6 oz (68 kg)     Height 11/14/24 1841 5' 6 (1.676 m)     Head Circumference --      Peak Flow --      Pain Score 11/14/24 1841 0     Pain Loc --      Pain Education --      Exclude from Growth Chart --     Most recent vital signs: Vitals:   11/14/24 1844  BP: 107/75  Pulse: 69  Resp: 14  Temp: 97.9 F (36.6 C)  SpO2: 100%   General: Awake, alert, oriented. CV:  Good peripheral perfusion. Regular rate and rhythm. Resp:  Normal effort. Lungs clear. Abd:  No distention.  Other:  PERRL. EOMI. Tongue midline. Face symmetric. Sensation intact in upper and lower extremities. Strength 5/5 in upper and lower extremities.    ED Results / Procedures / Treatments    Labs (all labs ordered are listed, but only abnormal results are displayed) Labs Reviewed  COMPREHENSIVE METABOLIC PANEL WITH GFR - Abnormal; Notable for the following components:      Result Value   Glucose, Bld 102 (*)    All other components within normal limits  CBC - Abnormal; Notable for the following components:   RBC 3.67 (*)    Hemoglobin 11.1 (*)    HCT 33.3 (*)    All other components within normal limits  URINALYSIS, ROUTINE W REFLEX MICROSCOPIC - Abnormal; Notable for the following components:   Color, Urine YELLOW (*)    APPearance CLEAR (*)    All other components within normal limits  TROPONIN T, HIGH SENSITIVITY  TROPONIN T, HIGH SENSITIVITY     EKG  I, Guadalupe Eagles, attending physician, personally viewed and interpreted this EKG  EKG Time: 1845 Rate: 68 Rhythm: sinus rhythm Axis: normal Intervals: qtc 450 QRS: narrow ST changes: st elevation v3 Impression: abnormal ekg  RADIOLOGY I independently interpreted and visualized the CT head. My interpretation: No ICH Radiology interpretation:  IMPRESSION:  1. No acute intracranial abnormality related to the fall and syncope.  2. Remote right frontal periventricular , left temporal periventricular, and  left thalamic infarcts.      PROCEDURES:  Critical Care performed: No   MEDICATIONS ORDERED IN ED: Medications - No data to display   IMPRESSION / MDM / ASSESSMENT AND PLAN / ED COURSE  I reviewed the triage vital signs and the nursing notes.                              Differential diagnosis includes, but is not limited to, anemia, electrolyte abnormality, ICH, arrhythmia, ACS  Patient's presentation is most consistent with acute presentation with potential threat to life or bodily function.   The patient is on the cardiac monitor to evaluate for evidence of arrhythmia and/or significant heart rate changes.  Patient presented to the emergency department today because of concerns  for a syncopal episode.  Patient awake alert and oriented to time my exam.  No focal neurodeficits.  Workup here without significant anemia or electrolyte abnormality.  Troponin was negative x 2.  Head CT without any ICH.  Patient was on cardiac monitor without any concerning arrhythmias.  This time somewhat unclear episode of the syncope however I do wonder if patient had a vasovagal episode.  Given the rate assuring workup I think is reasonable for patient be discharged.  Discussed with patient importance of follow-up with primary care.  Encouraged patient to return for any new or concerning symptoms.      FINAL CLINICAL IMPRESSION(S) / ED DIAGNOSES   Final diagnoses:  Syncope, unspecified syncope type    Note:  This document was prepared using Dragon voice recognition software and may include unintentional dictation errors.    Floy Roberts, MD 11/14/24 [phone]
